# Patient Record
Sex: Female | Born: 1975
Health system: Southern US, Community
[De-identification: ages and names within clinical notes are randomized; demographics above are authoritative.]

## PROBLEM LIST (undated history)

## (undated) ENCOUNTER — Inpatient Hospital Stay (HOSPITAL_COMMUNITY): Payer: Self-pay

## (undated) DIAGNOSIS — N9089 Other specified noninflammatory disorders of vulva and perineum: Secondary | ICD-10-CM

## (undated) DIAGNOSIS — O139 Gestational [pregnancy-induced] hypertension without significant proteinuria, unspecified trimester: Secondary | ICD-10-CM

## (undated) DIAGNOSIS — E7800 Pure hypercholesterolemia, unspecified: Secondary | ICD-10-CM

## (undated) DIAGNOSIS — N911 Secondary amenorrhea: Secondary | ICD-10-CM

## (undated) DIAGNOSIS — R7303 Prediabetes: Secondary | ICD-10-CM

## (undated) DIAGNOSIS — O926 Galactorrhea: Secondary | ICD-10-CM

## (undated) DIAGNOSIS — J302 Other seasonal allergic rhinitis: Secondary | ICD-10-CM

## (undated) DIAGNOSIS — O24419 Gestational diabetes mellitus in pregnancy, unspecified control: Secondary | ICD-10-CM

## (undated) DIAGNOSIS — N750 Cyst of Bartholin's gland: Secondary | ICD-10-CM

## (undated) DIAGNOSIS — O9089 Other complications of the puerperium, not elsewhere classified: Secondary | ICD-10-CM

## (undated) DIAGNOSIS — N920 Excessive and frequent menstruation with regular cycle: Secondary | ICD-10-CM

## (undated) DIAGNOSIS — N8300 Follicular cyst of ovary, unspecified side: Secondary | ICD-10-CM

## (undated) DIAGNOSIS — K644 Residual hemorrhoidal skin tags: Secondary | ICD-10-CM

## (undated) DIAGNOSIS — N946 Dysmenorrhea, unspecified: Secondary | ICD-10-CM

## (undated) DIAGNOSIS — D649 Anemia, unspecified: Secondary | ICD-10-CM

## (undated) DIAGNOSIS — N907 Vulvar cyst: Secondary | ICD-10-CM

## (undated) HISTORY — DX: Dysmenorrhea, unspecified: N94.6

## (undated) HISTORY — DX: Other specified noninflammatory disorders of vulva and perineum: N90.89

## (undated) HISTORY — DX: Follicular cyst of ovary, unspecified side: N83.00

## (undated) HISTORY — DX: Galactorrhea: O92.6

## (undated) HISTORY — DX: Secondary amenorrhea: N91.1

## (undated) HISTORY — DX: Excessive and frequent menstruation with regular cycle: N92.0

## (undated) HISTORY — DX: Residual hemorrhoidal skin tags: K64.4

## (undated) HISTORY — DX: Cyst of Bartholin's gland: N75.0

## (undated) HISTORY — DX: Pure hypercholesterolemia, unspecified: E78.00

## (undated) HISTORY — DX: Other complications of the puerperium, not elsewhere classified: O90.89

## (undated) HISTORY — DX: Vulvar cyst: N90.7

---
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
## 1997-12-24 HISTORY — PX: DILATION AND CURETTAGE OF UTERUS: SHX78

## 2003-01-06 DIAGNOSIS — O1490 Unspecified pre-eclampsia, unspecified trimester: Secondary | ICD-10-CM

## 2007-12-25 HISTORY — PX: WISDOM TOOTH EXTRACTION: SHX21

## 2014-12-24 LAB — OB RESULTS CONSOLE ABO/RH

## 2015-05-25 LAB — OB RESULTS CONSOLE HEPATITIS B SURFACE ANTIGEN: HEP B S AG: NEGATIVE

## 2015-05-25 LAB — OB RESULTS CONSOLE HIV ANTIBODY (ROUTINE TESTING): HIV: NONREACTIVE

## 2015-05-25 LAB — OB RESULTS CONSOLE GC/CHLAMYDIA
CHLAMYDIA, DNA PROBE: NEGATIVE
Gonorrhea: NEGATIVE

## 2015-05-25 LAB — OB RESULTS CONSOLE HGB/HCT, BLOOD
HEMATOCRIT: 37 %
HEMOGLOBIN: 11.3 g/dL

## 2015-05-25 LAB — OB RESULTS CONSOLE ABO/RH: RH TYPE: POSITIVE

## 2015-05-25 LAB — OB RESULTS CONSOLE ANTIBODY SCREEN: Antibody Screen: NEGATIVE

## 2015-05-25 LAB — OB RESULTS CONSOLE PLATELET COUNT: Platelets: 274 10*3/uL

## 2015-05-25 LAB — OB RESULTS CONSOLE RUBELLA ANTIBODY, IGM: RUBELLA: IMMUNE

## 2015-05-25 LAB — OB RESULTS CONSOLE RPR: RPR: NONREACTIVE

## 2015-10-05 LAB — OB RESULTS CONSOLE RPR: RPR: NONREACTIVE

## 2015-11-24 LAB — OB RESULTS CONSOLE GBS: GBS: NEGATIVE

## 2015-11-30 ENCOUNTER — Inpatient Hospital Stay (HOSPITAL_COMMUNITY)
Admission: AD | Admit: 2015-11-30 | Discharge: 2015-11-30 | Disposition: A | Discharge status: Home / Self Care | Payer: 59 | Source: Ambulatory Visit | Attending: Obstetrics and Gynecology | Admitting: Obstetrics and Gynecology

## 2015-11-30 ENCOUNTER — Encounter (HOSPITAL_COMMUNITY): Payer: Self-pay | Admitting: *Deleted

## 2015-11-30 DIAGNOSIS — O26893 Other specified pregnancy related conditions, third trimester: Secondary | ICD-10-CM

## 2015-11-30 DIAGNOSIS — Z3A35 35 weeks gestation of pregnancy: Secondary | ICD-10-CM | POA: Insufficient documentation

## 2015-11-30 DIAGNOSIS — O09523 Supervision of elderly multigravida, third trimester: Secondary | ICD-10-CM | POA: Insufficient documentation

## 2015-11-30 DIAGNOSIS — R220 Localized swelling, mass and lump, head: Secondary | ICD-10-CM

## 2015-11-30 DIAGNOSIS — O1203 Gestational edema, third trimester: Secondary | ICD-10-CM | POA: Diagnosis not present

## 2015-11-30 DIAGNOSIS — Z3A37 37 weeks gestation of pregnancy: Secondary | ICD-10-CM | POA: Diagnosis not present

## 2015-11-30 HISTORY — DX: Gestational diabetes mellitus in pregnancy, unspecified control: O24.419

## 2015-11-30 HISTORY — DX: Gestational (pregnancy-induced) hypertension without significant proteinuria, unspecified trimester: O13.9

## 2015-11-30 LAB — URINALYSIS, ROUTINE W REFLEX MICROSCOPIC
Bilirubin Urine: NEGATIVE
GLUCOSE, UA: NEGATIVE mg/dL
HGB URINE DIPSTICK: NEGATIVE
KETONES UR: NEGATIVE mg/dL
Nitrite: NEGATIVE
PROTEIN: NEGATIVE mg/dL
Specific Gravity, Urine: <1.005 — ABNORMAL LOW (ref 1.005–1.030)
pH: 6.5 (ref 5.0–8.0)

## 2015-11-30 LAB — CBC WITH DIFFERENTIAL/PLATELET
Basophils Absolute: 0 10*3/uL (ref 0.0–0.1)
Basophils Relative: 0 %
Eosinophils Absolute: 0.1 10*3/uL (ref 0.0–0.7)
Eosinophils Relative: 1 %
HEMATOCRIT: 34.5 % — AB (ref 36.0–46.0)
Hemoglobin: 11.1 g/dL — ABNORMAL LOW (ref 12.0–15.0)
LYMPHS PCT: 22 %
Lymphs Abs: 1.8 10*3/uL (ref 0.7–4.0)
MCH: 27.7 pg (ref 26.0–34.0)
MCHC: 32.2 g/dL (ref 30.0–36.0)
MCV: 86 fL (ref 78.0–100.0)
MONO ABS: 0.7 10*3/uL (ref 0.1–1.0)
MONOS PCT: 8 %
NEUTROS ABS: 5.8 10*3/uL (ref 1.7–7.7)
Neutrophils Relative %: 69 %
Platelets: 169 10*3/uL (ref 150–400)
RBC: 4.01 MIL/uL (ref 3.87–5.11)
RDW: 19.2 % — AB (ref 11.5–15.5)
WBC: 8.4 10*3/uL (ref 4.0–10.5)

## 2015-11-30 LAB — COMPREHENSIVE METABOLIC PANEL
ALT: 14 U/L (ref 14–54)
ANION GAP: 7 (ref 5–15)
AST: 18 U/L (ref 15–41)
Albumin: 2.6 g/dL — ABNORMAL LOW (ref 3.5–5.0)
Alkaline Phosphatase: 100 U/L (ref 38–126)
BILIRUBIN TOTAL: 0.4 mg/dL (ref 0.3–1.2)
CO2: 22 mmol/L (ref 22–32)
Calcium: 8.7 mg/dL — ABNORMAL LOW (ref 8.9–10.3)
Chloride: 107 mmol/L (ref 101–111)
Creatinine, Ser: 0.51 mg/dL (ref 0.44–1.00)
Glucose, Bld: 94 mg/dL (ref 65–99)
POTASSIUM: 3.2 mmol/L — AB (ref 3.5–5.1)
Sodium: 136 mmol/L (ref 135–145)
TOTAL PROTEIN: 5.6 g/dL — AB (ref 6.5–8.1)

## 2015-11-30 LAB — URINE MICROSCOPIC-ADD ON: RBC / HPF: NONE SEEN RBC/hpf (ref 0–5)

## 2015-11-30 LAB — PROTEIN / CREATININE RATIO, URINE
CREATININE, URINE: 55 mg/dL
Total Protein, Urine: <6 mg/dL

## 2015-11-30 NOTE — MAU Note (Signed)
Pt presents to MAU with complaints of facial swelling. [facility] [place] and states that she decided to come here stating the physicians are not concerned about her swelling. Denies any leakage of fluid or vaginal bleeding.

## 2015-11-30 NOTE — MAU Provider Note (Signed)
History     CSN: [id number]  Arrival date and time: 11/30/15 4696   First Provider Initiated Contact with Patient 11/30/15 [phone]      Chief Complaint  Patient presents with  . Facial Swelling   HPI  39 yo G5P3013 at 35.2 weeks by LMP presents to the MAU for facial swelling. She has not experienced any HA, vision changes, or RUQ pain, but endorses new facial and hand swelling as well as worsening BLE swelling. Her BPs have been normal, though her systolic BP was 138 at her last appointment. She has a history of pre-eclampsia and gestational diabetes in her first pregnancy as well as gestational HTN in another. She also has a history of macrosomia (9 lbs), which she delivered vaginally. No other concerns during this pregnancy.   Prenatal Labwork: A+ Ab NEGATIVE HIV Neg RPR NR HepB Neg Rubella Immune 1h GTT 180; 3hr GTT passed CBC 10.4/35 Genetic Screening (Quad) Low Risk GC/Chlam neg/neg GBS NEGATIVE  Past Medical History  Diagnosis Date  . Gestational diabetes   . Pregnancy induced hypertension     Past Surgical History  Procedure Laterality Date  . Dilation and curettage of uterus      Family History  Problem Relation Age of Onset  . Stroke Mother     Social History  Substance Use Topics  . Smoking status: Never Smoker   . Smokeless tobacco: Never Used  . Alcohol Use: No    Allergies: No Known Allergies  Prescriptions prior to admission  Medication Sig Dispense Refill Last Dose  . cetirizine (ZYRTEC) 10 MG tablet Take 10 mg by mouth daily.   11/29/2015 at Unknown time  . ferrous sulfate 325 (65 FE) MG tablet Take 325 mg by mouth daily with breakfast.   11/29/2015 at Unknown time  . fluticasone (FLONASE) 50 MCG/ACT nasal spray Place into both nostrils daily.   11/29/2015 at Unknown time  . Prenatal Vit-Fe Fumarate-FA (PRENATAL MULTIVITAMIN) TABS tablet Take 1 tablet by mouth daily at 12 noon.   11/29/2015 at Unknown time    ROS  All other pertinent ROS  negative with exception of those in the HPI Physical Exam   Blood pressure 123/84, pulse 87, resp. rate 18, height  (1.626 m), weight 101.606 kg (224 lb), last menstrual period 03/28/2015.  Physical Exam  Gen: Alert, cooperative, NAD CV: RRR Lungs: CTAB Abd: Gravid, non tender in RUQ Extremities: Non pitting edema to bilateral ankles and feet; nonedematous BUE  FHT: Reactive, reassuring, Cat 1 No contractions noted  MAU Course  Procedures  MDM Patient has a history of gHTN as well as pre-eclampsia. Her BPs have been normal in the MAU, however would like to obtain UPC as well as CBC/CMP to rule out pre-eclampsia and HELLP.    Results for orders placed or performed during the hospital encounter of 11/30/15 (from the past 24 hour(s))  Urinalysis, Routine w reflex microscopic (not at [facility])     Status: Abnormal   Collection Time: 11/30/15  8:50 AM  Result Value Ref Range   Color, Urine YELLOW YELLOW   APPearance CLEAR CLEAR   Specific Gravity, Urine <1.005 (L) 1.005 - 1.030   pH 6.5 5.0 - 8.0   Glucose, UA NEGATIVE NEGATIVE mg/dL   Hgb urine dipstick NEGATIVE NEGATIVE   Bilirubin Urine NEGATIVE NEGATIVE   Ketones, ur NEGATIVE NEGATIVE mg/dL   Protein, ur NEGATIVE NEGATIVE mg/dL   Nitrite NEGATIVE NEGATIVE   Leukocytes, UA SMALL (A) NEGATIVE  Urine microscopic-add  on     Status: Abnormal   Collection Time: 11/30/15  8:50 AM  Result Value Ref Range   Squamous Epithelial / LPF 0-5 (A) NONE SEEN   WBC, UA 0-5 0 - 5 WBC/hpf   RBC / HPF NONE SEEN 0 - 5 RBC/hpf   Bacteria, UA RARE (A) NONE SEEN  Protein / creatinine ratio, urine     Status: None   Collection Time: 11/30/15  8:50 AM  Result Value Ref Range   Creatinine, Urine 55.00 mg/dL   Total Protein, Urine <6 mg/dL   Protein Creatinine Ratio        0.00 - 0.15 mg/mg[Cre]  CBC with Differential/Platelet     Status: Abnormal   Collection Time: 11/30/15  9:49 AM  Result Value Ref Range   WBC 8.4 4.0 - 10.5 K/uL   RBC  4.01 3.87 - 5.11 MIL/uL   Hemoglobin 11.1 (L) 12.0 - 15.0 g/dL   HCT 16.1 (L) 09.6 - 04.5 %   MCV 86.0 78.0 - 100.0 fL   MCH 27.7 26.0 - 34.0 pg   MCHC 32.2 30.0 - 36.0 g/dL   RDW 40.9 (H) 81.1 - 91.4 %   Platelets 169 150 - 400 K/uL   Neutrophils Relative % 69 %   Neutro Abs 5.8 1.7 - 7.7 K/uL   Lymphocytes Relative 22 %   Lymphs Abs 1.8 0.7 - 4.0 K/uL   Monocytes Relative 8 %   Monocytes Absolute 0.7 0.1 - 1.0 K/uL   Eosinophils Relative 1 %   Eosinophils Absolute 0.1 0.0 - 0.7 K/uL   Basophils Relative 0 %   Basophils Absolute 0.0 0.0 - 0.1 K/uL  Comprehensive metabolic panel     Status: Abnormal (Preliminary result)   Collection Time: 11/30/15  9:49 AM  Result Value Ref Range   Sodium 136 135 - 145 mmol/L   Potassium 3.2 (L) 3.5 - 5.1 mmol/L   Chloride 107 101 - 111 mmol/L   CO2 22 22 - 32 mmol/L   Glucose, Bld 94 65 - 99 mg/dL   BUN PENDING 6 - 20 mg/dL   Creatinine, Ser 7.82 0.44 - 1.00 mg/dL   Calcium 8.7 (L) 8.9 - 10.3 mg/dL   Total Protein 5.6 (L) 6.5 - 8.1 g/dL   Albumin 2.6 (L) 3.5 - 5.0 g/dL   AST 18 15 - 41 U/L   ALT 14 14 - 54 U/L   Alkaline Phosphatase 100 38 - 126 U/L   Total Bilirubin 0.4 0.3 - 1.2 mg/dL   GFR calc non Af Amer >60 >60 mL/min   GFR calc Af Amer >60 >60 mL/min   Anion gap 7 5 - 15    Assessment and Plan  39 yo G5P3013 at 35.2 weeks by LMP presents to the MAU for facial swelling. Pre-eclampsia labwork negative with "unable to calculate" UPC due to low proteinuria. Advised to continue with BP monitoring given her history. She is seeing her PCP weekly and will f/u with them. Medical Records will be scanned to chart and patient discharged to home.   Linda Maldonado 11/30/2015, 9:43 AM   OB fellow attestation:  I have seen and examined this patient; I agree with above documentation in the resident's note.   Linda Maldonado is a 39 y.o. [phone] reporting facial swelling +FM, denies LOF, VB, contractions, vaginal discharge.  PE: BP 131/70  mmHg  Pulse 83  Resp 18  Ht  (1.626 m)  Wt 224 lb (101.606 kg)  BMI 38.43 kg/m2  LMP 03/28/2015 Gen: calm comfortable, NAD Resp: normal effort, no distress Abd: gravid  ROS, labs, PMH reviewed NST reactive   Plan: -Preeclampsia labs negative -Patient at risk for PreX given history but does not meet criteria at this time.  -follow up with OB provider  Federico FlakeKimberly Niles Keyshawna Prouse, MD 11:52 AM

## 2015-12-25 NOTE — L&D Delivery Note (Cosign Needed)
Delivery Note  Patient presented in active labor and proceeded without augmentation. Intermittent late decels in active labor and during the second stage.  At 11:53 AM a viable female was delivered via Vaginal, Spontaneous Delivery (Presentation: Right Occiput Anterior).  APGAR: 8, 9; weight 4005 g.   Placenta status: Intact, Spontaneous.  Cord: 3 vessels with the following complications: None.  Cord pH: not obtained.  Anesthesia: Epidural  Episiotomy: None Lacerations: 2nd degree;Perineal Suture Repair: 3.0 vicryl Est. Blood Loss (mL):  50  Mom to postpartum.  Baby to Couplet care / Skin to Skin.  Cherrie Gauzeoah B Meli Faley 12/29/2015, 12:15 PM

## 2015-12-28 ENCOUNTER — Encounter (HOSPITAL_COMMUNITY): Payer: Self-pay | Admitting: *Deleted

## 2015-12-28 ENCOUNTER — Inpatient Hospital Stay (HOSPITAL_COMMUNITY)
Admission: AD | Admit: 2015-12-28 | Discharge: 2015-12-28 | Disposition: A | Discharge status: Home / Self Care | Payer: 59 | Source: Ambulatory Visit | Attending: Obstetrics & Gynecology | Admitting: Obstetrics & Gynecology

## 2015-12-28 NOTE — MAU Note (Signed)
Pt c/o contractions 6-7 mins apart. Denies LOF or vag bleeding. +FM

## 2015-12-28 NOTE — MAU Note (Signed)
Urine in lab 

## 2015-12-28 NOTE — Discharge Instructions (Signed)
Keep your scheduled appointment for prenatal care. Call your doctor or provider on call with further concerns.

## 2015-12-29 ENCOUNTER — Inpatient Hospital Stay (HOSPITAL_COMMUNITY): Payer: 59 | Admitting: Anesthesiology

## 2015-12-29 ENCOUNTER — Inpatient Hospital Stay (HOSPITAL_COMMUNITY)
Admission: AD | Admit: 2015-12-29 | Discharge: 2015-12-31 | DRG: 775 | Disposition: A | Discharge status: Home / Self Care | Payer: 59 | Source: Ambulatory Visit | Attending: Family Medicine | Admitting: Family Medicine

## 2015-12-29 ENCOUNTER — Encounter (HOSPITAL_COMMUNITY): Payer: Self-pay

## 2015-12-29 DIAGNOSIS — Z6841 Body Mass Index (BMI) 40.0 and over, adult: Secondary | ICD-10-CM

## 2015-12-29 DIAGNOSIS — Z3A39 39 weeks gestation of pregnancy: Secondary | ICD-10-CM

## 2015-12-29 DIAGNOSIS — Z823 Family history of stroke: Secondary | ICD-10-CM | POA: Diagnosis not present

## 2015-12-29 DIAGNOSIS — IMO0001 Reserved for inherently not codable concepts without codable children: Secondary | ICD-10-CM

## 2015-12-29 DIAGNOSIS — O99214 Obesity complicating childbirth: Secondary | ICD-10-CM | POA: Diagnosis present

## 2015-12-29 LAB — CBC
HCT: 40 % (ref 36.0–46.0)
Hemoglobin: 13.5 g/dL (ref 12.0–15.0)
MCH: 29.3 pg (ref 26.0–34.0)
MCHC: 33.8 g/dL (ref 30.0–36.0)
MCV: 87 fL (ref 78.0–100.0)
PLATELETS: 203 10*3/uL (ref 150–400)
RBC: 4.6 MIL/uL (ref 3.87–5.11)
RDW: 17.5 % — AB (ref 11.5–15.5)
WBC: 12 10*3/uL — AB (ref 4.0–10.5)

## 2015-12-29 LAB — ABO/RH: ABO/RH(D): A POS

## 2015-12-29 LAB — RPR: RPR Ser Ql: NONREACTIVE

## 2015-12-29 LAB — TYPE AND SCREEN
ABO/RH(D): A POS
ANTIBODY SCREEN: NEGATIVE

## 2015-12-29 MED ORDER — BENZOCAINE-MENTHOL 20-0.5 % EX AERO
1.0000 "application " | INHALATION_SPRAY | CUTANEOUS | Status: DC | PRN
  Filled 2015-12-29: qty 56

## 2015-12-29 MED ORDER — LACTATED RINGERS IV SOLN
INTRAVENOUS | Status: DC
  Administered 2015-12-29: 09:00:00 via INTRAVENOUS

## 2015-12-29 MED ORDER — ACETAMINOPHEN 325 MG PO TABS
650.0000 mg | ORAL_TABLET | ORAL | Status: DC | PRN
  Administered 2015-12-29: 650 mg via ORAL
  Filled 2015-12-29: qty 2

## 2015-12-29 MED ORDER — PHENYLEPHRINE 40 MCG/ML (10ML) SYRINGE FOR IV PUSH (FOR BLOOD PRESSURE SUPPORT)
80.0000 ug | PREFILLED_SYRINGE | INTRAVENOUS | Status: DC | PRN
  Filled 2015-12-29: qty 2
  Filled 2015-12-29: qty 20

## 2015-12-29 MED ORDER — CITRIC ACID-SODIUM CITRATE 334-500 MG/5ML PO SOLN: 30.0000 mL | ORAL | Status: DC | PRN

## 2015-12-29 MED ORDER — FLEET ENEMA 7-19 GM/118ML RE ENEM: 1.0000 | ENEMA | RECTAL | Status: DC | PRN

## 2015-12-29 MED ORDER — ONDANSETRON HCL 4 MG PO TABS: 4.0000 mg | ORAL_TABLET | ORAL | Status: DC | PRN

## 2015-12-29 MED ORDER — SIMETHICONE 80 MG PO CHEW: 80.0000 mg | CHEWABLE_TABLET | ORAL | Status: DC | PRN

## 2015-12-29 MED ORDER — SENNOSIDES-DOCUSATE SODIUM 8.6-50 MG PO TABS
2.0000 | ORAL_TABLET | ORAL | Status: DC
  Administered 2015-12-29 – 2015-12-30 (×2): 2 via ORAL
  Filled 2015-12-29 (×2): qty 2

## 2015-12-29 MED ORDER — TETANUS-DIPHTH-ACELL PERTUSSIS 5-2.5-18.5 LF-MCG/0.5 IM SUSP: 0.5000 mL | Freq: Once | INTRAMUSCULAR | Status: DC

## 2015-12-29 MED ORDER — OXYCODONE-ACETAMINOPHEN 5-325 MG PO TABS: 2.0000 | ORAL_TABLET | ORAL | Status: DC | PRN

## 2015-12-29 MED ORDER — DIPHENHYDRAMINE HCL 50 MG/ML IJ SOLN: 12.5000 mg | INTRAMUSCULAR | Status: DC | PRN

## 2015-12-29 MED ORDER — LANOLIN HYDROUS EX OINT: TOPICAL_OINTMENT | CUTANEOUS | Status: DC | PRN

## 2015-12-29 MED ORDER — FENTANYL 2.5 MCG/ML BUPIVACAINE 1/10 % EPIDURAL INFUSION (WH - ANES)
14.0000 mL/h | INTRAMUSCULAR | Status: DC | PRN
  Administered 2015-12-29 (×2): 14 mL/h via EPIDURAL
  Filled 2015-12-29 (×2): qty 125

## 2015-12-29 MED ORDER — LIDOCAINE HCL (PF) 1 % IJ SOLN
30.0000 mL | INTRAMUSCULAR | Status: DC | PRN
Start: 2015-12-29 — End: 2015-12-29
  Filled 2015-12-29: qty 30

## 2015-12-29 MED ORDER — LACTATED RINGERS IV SOLN
2.5000 [IU]/h | INTRAVENOUS | Status: DC
  Administered 2015-12-29: 2.5 [IU]/h via INTRAVENOUS
  Filled 2015-12-29: qty 4

## 2015-12-29 MED ORDER — OXYCODONE-ACETAMINOPHEN 5-325 MG PO TABS: 1.0000 | ORAL_TABLET | ORAL | Status: DC | PRN

## 2015-12-29 MED ORDER — ONDANSETRON HCL 4 MG/2ML IJ SOLN
4.0000 mg | Freq: Four times a day (QID) | INTRAMUSCULAR | Status: DC | PRN
  Administered 2015-12-29: 4 mg via INTRAVENOUS
  Filled 2015-12-29: qty 2

## 2015-12-29 MED ORDER — IBUPROFEN 600 MG PO TABS
600.0000 mg | ORAL_TABLET | Freq: Four times a day (QID) | ORAL | Status: DC
  Administered 2015-12-29 – 2015-12-31 (×8): 600 mg via ORAL
  Filled 2015-12-29 (×8): qty 1

## 2015-12-29 MED ORDER — EPHEDRINE 5 MG/ML INJ
10.0000 mg | INTRAVENOUS | Status: DC | PRN
  Filled 2015-12-29: qty 2

## 2015-12-29 MED ORDER — PRENATAL MULTIVITAMIN CH
1.0000 | ORAL_TABLET | Freq: Every day | ORAL | Status: DC
Start: 2015-12-30 — End: 2015-12-31
  Administered 2015-12-30 – 2015-12-31 (×2): 1 via ORAL
  Filled 2015-12-29 (×2): qty 1

## 2015-12-29 MED ORDER — DIBUCAINE 1 % RE OINT
1.0000 "application " | TOPICAL_OINTMENT | RECTAL | Status: DC | PRN
  Administered 2015-12-31: 1 via RECTAL
  Filled 2015-12-29: qty 28

## 2015-12-29 MED ORDER — WITCH HAZEL-GLYCERIN EX PADS
1.0000 "application " | MEDICATED_PAD | CUTANEOUS | Status: DC | PRN
  Administered 2015-12-31: 1 via TOPICAL

## 2015-12-29 MED ORDER — LIDOCAINE HCL (PF) 1 % IJ SOLN
INTRAMUSCULAR | Status: DC | PRN
  Administered 2015-12-29 (×2): 4 mL

## 2015-12-29 MED ORDER — FENTANYL CITRATE (PF) 100 MCG/2ML IJ SOLN
50.0000 ug | INTRAMUSCULAR | Status: DC | PRN
  Administered 2015-12-29: 50 ug via INTRAVENOUS
  Filled 2015-12-29: qty 2

## 2015-12-29 MED ORDER — ZOLPIDEM TARTRATE 5 MG PO TABS: 5.0000 mg | ORAL_TABLET | Freq: Every evening | ORAL | Status: DC | PRN

## 2015-12-29 MED ORDER — ACETAMINOPHEN 325 MG PO TABS: 650.0000 mg | ORAL_TABLET | ORAL | Status: DC | PRN

## 2015-12-29 MED ORDER — ONDANSETRON HCL 4 MG/2ML IJ SOLN: 4.0000 mg | INTRAMUSCULAR | Status: DC | PRN

## 2015-12-29 MED ORDER — LACTATED RINGERS IV SOLN
500.0000 mL | INTRAVENOUS | Status: DC | PRN
  Administered 2015-12-29: 500 mL via INTRAVENOUS

## 2015-12-29 MED ORDER — DIPHENHYDRAMINE HCL 25 MG PO CAPS: 25.0000 mg | ORAL_CAPSULE | Freq: Four times a day (QID) | ORAL | Status: DC | PRN

## 2015-12-29 MED ORDER — OXYTOCIN BOLUS FROM INFUSION: 500.0000 mL | INTRAVENOUS | Status: DC

## 2015-12-29 NOTE — Anesthesia Procedure Notes (Signed)
Epidural Patient location during procedure: OB  Staffing Anesthesiologist: Milli Woolridge Performed by: anesthesiologist   Preanesthetic Checklist Completed: patient identified, site marked, surgical consent, pre-op evaluation, timeout performed, IV checked, risks and benefits discussed and monitors and equipment checked  Epidural Patient position: sitting Prep: site prepped and draped and DuraPrep Patient monitoring: continuous pulse ox and blood pressure Approach: midline Location: L3-L4 Injection technique: LOR saline  Needle:  Needle type: Tuohy  Needle gauge: 17 G Needle length: 9 cm and 9 Needle insertion depth: 7 cm Catheter type: closed end flexible Catheter size: 19 Gauge Catheter at skin depth: 12 cm Test dose: negative  Assessment Events: blood not aspirated, injection not painful, no injection resistance, negative IV test and no paresthesia  Additional Notes Patient identified. Risks/Benefits/Options discussed with patient including but not limited to bleeding, infection, nerve damage, paralysis, failed block, incomplete pain control, headache, blood pressure changes, nausea, vomiting, reactions to medication both or allergic, itching and postpartum back pain. Confirmed with bedside nurse the patient's most recent platelet count. Confirmed with patient that they are not currently taking any anticoagulation, have any bleeding history or any family history of bleeding disorders. Patient expressed understanding and wished to proceed. All questions were answered. Sterile technique was used throughout the entire procedure. Please see nursing notes for vital signs. Test dose was given through epidural catheter and negative prior to continuing to dose epidural or start infusion. Warning signs of high block given to the patient including shortness of breath, tingling/numbness in hands, complete motor block, or any concerning symptoms with instructions to call for help. Patient was  given instructions on fall risk and not to get out of bed. All questions and concerns addressed with instructions to call with any issues or inadequate analgesia.      

## 2015-12-29 NOTE — Progress Notes (Signed)
Notified of pt arrival in MAU and exam. Will come see pt 

## 2015-12-29 NOTE — Lactation Note (Signed)
This note was copied from the chart of Linda Maldonado. Lactation Consultation Note  Patient Name: Linda Tamela Oddingelita Doebler GEXBM'WToday's Date: 12/29/2015 Reason for consult: Initial assessment Baby at 10 hr of life and mom reports feeding are going well. Encouraged mom to call at next feeding so RN or LC could see a latch. Mom denies breast or nipple pain, no skin break down noted. Discussed baby behavior, feeding frequency, baby belly size, voids, wt loss, breast changes, and nipple care. She stated that she can manually express and has seen colostrum. Given lactation handouts. Aware of OP services and support group. Shew ill call as needed for bf help.     Maternal Data Has patient been taught Hand Expression?: Yes Does the patient have breastfeeding experience prior to this delivery?: Yes  Feeding Feeding Type: Breast Fed Length of feed: 15 min  LATCH Score/Interventions                      Lactation Tools Discussed/Used WIC Program: Yes   Consult Status Consult Status: Follow-up Date: 12/30/15 Follow-up type: In-patient    Rulon Eisenmengerlizabeth E Kyrel Leighton 12/29/2015, 10:11 PM

## 2015-12-29 NOTE — H&P (Signed)
Linda Maldonado is a 40 y.o. female [phone] with IUP at [redacted]w[redacted]d presenting for contractions. Pt states she has been having regular, every 3-5 minutes contractions, associated with none vaginal bleeding.  Membranes are intact, with active fetal movement.   [facility] 8 [facility]  Prenatal History/Complications:  Past Medical History: Past Medical History  Diagnosis Date  . Gestational diabetes   . Pregnancy induced hypertension     Past Surgical History: Past Surgical History  Procedure Laterality Date  . Dilation and curettage of uterus      Obstetrical History: OB History    Gravida Para Term Preterm AB TAB SAB Ectopic Multiple Living   4 3 3       3       Gynecological History: OB History    Gravida Para Term Preterm AB TAB SAB Ectopic Multiple Living   4 3 3       3       Social History: Social History   Social History  . Marital Status: Single    Spouse Name: N/A  . Number of Children: N/A  . Years of Education: N/A   Social History Main Topics  . Smoking status: Never Smoker   . Smokeless tobacco: Never Used  . Alcohol Use: No  . Drug Use: No  . Sexual Activity: Yes    Birth Control/ Protection: Pill   Other Topics Concern  . None   Social History Narrative    Family History: Family History  Problem Relation Age of Onset  . Stroke Mother     Allergies: No Known Allergies  Prescriptions prior to admission  Medication Sig Dispense Refill Last Dose  . acetaminophen (TYLENOL) 325 MG tablet Take 325 mg by mouth every 6 (six) hours as needed for fever.   Past Week at Unknown time  . cetirizine (ZYRTEC) 10 MG tablet Take 10 mg by mouth daily.   12/27/2015 at Unknown time  . fluticasone (FLONASE) 50 MCG/ACT nasal spray Place into both nostrils daily.   12/27/2015 at Unknown time  . IRON PO Take 1 tablet by mouth daily.   12/28/2015 at Unknown time  . Prenatal Vit-Fe Fumarate-FA (PRENATAL MULTIVITAMIN) TABS tablet Take 1 tablet by mouth  daily at 12 noon.   12/28/2015 at Unknown time     Review of Systems   Constitutional: No fevers or chills  Blood pressure 149/61, pulse 113, resp. rate 20, height 5\' 2"  (1.575 m), weight 105.325 kg (232 lb 3.2 oz), last menstrual period 03/28/2015, SpO2 100 %. General appearance: alert, cooperative and appears stated age Lungs: clear to auscultation bilaterally Heart: regular rate and rhythm Abdomen: soft, non-tender; bowel sounds normal Extremities: Homans sign is negative, no sign of DVT DTR's normal Presentation: cephalic Fetal monitoringBaseline: 135 bpm, Variability: Good {> 6 bpm), Accelerations: Reactive and Decelerations: Absent Uterine activity: Contractions every 3-5 minutes  Dilation: 4.5 Effacement (%): 60 Station: -2 Exam by:: Scientist, product/process development RN   Prenatal labs: ABO, Rh: --/Positive/-- (06/01 0000) Antibody: Negative (06/01 0000) Rubella: !Error! RPR: Nonreactive (10/12 0000)  HBsAg: Negative (06/01 0000)  HIV: Non-reactive (06/01 0000)  GBS: Negative (12/01 0000)  3 hr Glucola 96, 178, 96, 69 Genetic screening  Negative Anatomy US Normal but incomplete. Follow up scan not available   Prenatal Transfer Tool  Maternal Diabetes: No Genetic Screening: Normal Maternal Ultrasounds/Referrals: Normal Fetal Ultrasounds or other Referrals:  None Maternal Substance Abuse:  No Significant Maternal Medications:  None Significant Maternal Lab Results: None  No results found for this or any previous visit (from the past 24 hour(s)).  Assessment: Linda Maldonado is a 40 y.o. [phone] at 8164w3d here for active labor #Labor: SOL, expectant management #Pain: Fentanyl and epidural upon request #FWB: Cat 1 #ID:  GBS negative #MOF: Breast #MOC: Unsure,  interested in BTL eventually #Circ:  Yes  Jacquiline DoeCaleb Parker 12/29/2015, 2:15 AM   CNM attestation:  I have seen and examined this patient; I agree with above documentation in the resident's note.   Linda Maldonado is a  40 y.o. [phone] here for labor  PE: BP 136/68 mmHg  Pulse 80  Temp(Src) 97.8 F (36.6 C) (Axillary)  Resp 16  Ht 5\' 2"  (1.575 m)  Wt 105.235 kg (232 lb)  BMI 42.42 kg/m2  SpO2 100%  LMP 03/28/2015  Resp: normal effort, no distress Abd: gravid  ROS, labs, PMH reviewed  [facility]: [facility] Expectant management Anticipate SVD  Cam HaiSHAW, KIMBERLY CNM 12/29/2015, 4:46 AM

## 2015-12-29 NOTE — MAU Note (Signed)
Contractions every 4-5 mins. Denies LOF. Having some spotting-cervix was 2 cm yesterday. +FM

## 2015-12-29 NOTE — Anesthesia Preprocedure Evaluation (Signed)
Anesthesia Evaluation  Patient identified by MRN, date of birth, ID band Patient awake    Reviewed: Allergy & Precautions, NPO status , Patient's Chart, lab work & pertinent test results  History of Anesthesia Complications Negative for: history of anesthetic complications  Airway Mallampati: II  TM Distance: >3 FB Neck ROM: Full    Dental no notable dental hx. (+) Dental Advisory Given   Pulmonary neg pulmonary ROS,    Pulmonary exam normal breath sounds clear to auscultation       Cardiovascular negative cardio ROS Normal cardiovascular exam Rhythm:Regular Rate:Normal     Neuro/Psych negative neurological ROS  negative psych ROS   GI/Hepatic negative GI ROS, Neg liver ROS,   Endo/Other  Morbid obesity  Renal/GU negative Renal ROS  negative genitourinary   Musculoskeletal negative musculoskeletal ROS (+)   Abdominal   Peds negative pediatric ROS (+)  Hematology negative hematology ROS (+)   Anesthesia Other Findings   Reproductive/Obstetrics (+) Pregnancy                             Anesthesia Physical Anesthesia Plan  ASA: III  Anesthesia Plan: Epidural   Post-op Pain Management:    Induction:   Airway Management Planned:   Additional Equipment:   Intra-op Plan:   Post-operative Plan:   Informed Consent: I have reviewed the patients History and Physical, chart, labs and discussed the procedure including the risks, benefits and alternatives for the proposed anesthesia with the patient or authorized representative who has indicated his/her understanding and acceptance.   Dental advisory given  Plan Discussed with: CRNA  Anesthesia Plan Comments:         Anesthesia Quick Evaluation  

## 2015-12-29 NOTE — Progress Notes (Signed)
Pt. Was 2 below and firm, then at next check a few hours later she was 2 above and to the left but still firm.  Pt. Voided 550 and was rechecked.  She was then 2 above but midline and still firm.  Bleeding has been small throughout the day.  Report given to Night shift RN.

## 2015-12-29 NOTE — Progress Notes (Signed)
Linda Maldonado is a 40 y.o. [phone] at 2368w3d  admitted for active labor  Subjective: Comfortable with epidural  Objective: Filed Vitals:   12/29/15 [id number] 12/29/15 0631 12/29/15 [id number] 12/29/15 0638  BP:  132/77    Pulse: 80 87 77 87  Temp:      TempSrc:      Resp:      Height:      Weight:      SpO2: 98%  97% 97%      FHT:  FHR: 150 bpm, variability: moderate,  accelerations:  Present,  decelerations:  Absent UC:   regular, every 1-3 minutes SVE:   Dilation: 6.5 Effacement (%): 80 Station: -2 Exam by:: M.Merrill, RN  Labs: Lab Results  Component Value Date   WBC 12.0* 12/29/2015   HGB 13.5 12/29/2015   HCT 40.0 12/29/2015   MCV 87.0 12/29/2015   PLT 203 12/29/2015    Assessment / Plan: Spontaneous labor, progressing normally  Labor: Progressing normally Fetal Wellbeing:  Category I Pain Control:  Epidural Anticipated MOD:  NSVD  Linda Maldonado 12/29/2015, 6:59 AM

## 2015-12-29 NOTE — Progress Notes (Signed)
Labor Progress Note  Obie Dredgengelita S Stamper is a 40 y.o. [phone] at 4923w3d  admitted for active labor  S:  Patient doing well. No concerns. Comfortable with epidural.   O:  BP 128/65 mmHg  Pulse 100  Temp(Src) 98.3 F (36.8 C) (Oral)  Resp 18  Ht 5\' 2"  (1.575 m)  Wt 105.235 kg (232 lb)  BMI 42.42 kg/m2  SpO2 100%  LMP 03/28/2015    FHT:  FHR: 120-130 bpm, variability: moderate,  accelerations:  Present,  decelerations:  Present variable  UC:   Every 1-3 mins SVE:   Dilation: 9 Effacement (%): 100 Station: +1 Exam by:: Wouk, MD SROM 1/5  At 0455 clear   Labs: Lab Results  Component Value Date   WBC 12.0* 12/29/2015   HGB 13.5 12/29/2015   HCT 40.0 12/29/2015   MCV 87.0 12/29/2015   PLT 203 12/29/2015    Assessment / Plan: 40 y.o. G4P3003 6023w3d in active labor Spontaneous labor, progressing normally  Labor: Progressing normally Fetal Wellbeing:  Category I Pain Control:  Epidural Anticipated MOD:  NSVD  Expectant management   Palma HolterKanishka G Deloss Amico PGY1 Family Medicine

## 2015-12-30 NOTE — Anesthesia Postprocedure Evaluation (Signed)
Anesthesia Post Note  Patient: Linda Maldonado  Procedure(s) Performed: * No procedures listed *  Patient location during evaluation: Mother Baby Anesthesia Type: Epidural Level of consciousness: oriented and awake and alert Pain management: pain level controlled Vital Signs Assessment: post-procedure vital signs reviewed and stable Respiratory status: spontaneous breathing, nonlabored ventilation and respiratory function stable Cardiovascular status: blood pressure returned to baseline Postop Assessment: no headache, no backache, patient able to bend at knees, no signs of nausea or vomiting and adequate PO intake Anesthetic complications: no    Last Vitals:  Filed Vitals:   12/30/15 0200 12/30/15 0612  BP: 135/68 130/64  Pulse: 81 81  Temp: 36.8 C 36.6 C  Resp: 18 18    Last Pain:  Filed Vitals:   12/30/15 0613  PainSc: 2                  Kambra Beachem

## 2015-12-30 NOTE — Lactation Note (Signed)
This note was copied from the chart of Linda Maldonado. Lactation Consultation Note  Patient Name: Linda Tamela Oddingelita Jarmon ZOXWR'UToday's Date: 12/30/2015 Reason for consult: Follow-up assessment  Experienced breastfeeding Mom has no questions or concerns.   Lurline HareRichey, Riot Barrick [facility] 12/30/2015, 10:08 AM

## 2015-12-31 MED ORDER — IBUPROFEN 600 MG PO TABS: 600.0000 mg | ORAL_TABLET | Freq: Four times a day (QID) | ORAL | Status: DC | PRN

## 2015-12-31 NOTE — Discharge Summary (Signed)
OB Discharge Summary     Patient Name: Linda Maldonado DOB: 1976-03-29 MRN: [id number]  Date of admission: 12/29/2015 Delivering MD: Shonna Chock BEDFORD   Date of discharge: 12/31/2015  Admitting diagnosis: 40 WEEKS CTX Intrauterine pregnancy: [redacted]w[redacted]d     Secondary diagnosis:  Active Problems:   Active labor  Additional problems: none     Discharge diagnosis: Term Pregnancy Delivered                                                                                                Post partum procedures:none  Augmentation: none  Complications: None  Hospital course:  Onset of Labor With Vaginal Delivery     40 y.o. yo [id number] at [redacted]w[redacted]d was admitted in Latent Labor on 12/29/2015. Patient had an uncomplicated labor course as follows:  Membrane Rupture Time/Date: 4:55 AM ,12/29/2015   Intrapartum Procedures: Episiotomy: None [1]                                         Lacerations:  2nd degree [3];Perineal [11]  Patient had a delivery of a Viable infant. 12/29/2015  Information for the patient's newborn:  Linda, Maldonado [[id number]]  Delivery Method: Vaginal, Spontaneous Delivery (Filed from Delivery Summary)    Pateint had an uncomplicated postpartum course.  She is ambulating, tolerating a regular diet, passing flatus, and urinating well. Patient is discharged home in stable condition on 12/31/2015.    Physical exam  Filed Vitals:   12/30/15 0200 12/30/15 0612 12/30/15 1800 12/31/15 0504  BP: 135/68 130/64 130/78 123/67  Pulse: 81 81 82 73  Temp: 98.2 F (36.8 C) 97.9 F (36.6 C) 98.4 F (36.9 C) 98 F (36.7 C)  TempSrc: Oral Oral Oral Oral  Resp: 18 18 19 18   Height:      Weight:      SpO2:   100% 99%   General: alert and cooperative Lochia: appropriate Uterine Fundus: firm Incision: N/A DVT Evaluation: No evidence of DVT seen on physical exam. Labs: Lab Results  Component Value Date   WBC 12.0* 12/29/2015   HGB 13.5 12/29/2015   HCT 40.0 12/29/2015   MCV  87.0 12/29/2015   PLT 203 12/29/2015   CMP Latest Ref Rng 11/30/2015  Glucose 65 - 99 mg/dL 94  BUN 6 - 20 mg/dL <1(H)  Creatinine 0.86 - 1.00 mg/dL 5.78  Sodium 469 - 629 mmol/L 136  Potassium 3.5 - 5.1 mmol/L 3.2(L)  Chloride 101 - 111 mmol/L 107  CO2 22 - 32 mmol/L 22  Calcium 8.9 - 10.3 mg/dL 5.2(W)  Total Protein 6.5 - 8.1 g/dL 4.1(L)  Total Bilirubin 0.3 - 1.2 mg/dL 0.4  Alkaline Phos 38 - 126 U/L 100  AST 15 - 41 U/L 18  ALT 14 - 54 U/L 14    Discharge instruction: per After Visit Summary and "Baby and Me Booklet".  After visit meds:    Medication List    STOP taking these medications  acetaminophen 325 MG tablet  Commonly known as:  TYLENOL     cetirizine 10 MG tablet  Commonly known as:  ZYRTEC      TAKE these medications        fluticasone 50 MCG/ACT nasal spray  Commonly known as:  FLONASE  Place into both nostrils daily.     ibuprofen 600 MG tablet  Commonly known as:  ADVIL,MOTRIN  Take 1 tablet (600 mg total) by mouth every 6 (six) hours as needed.     IRON PO  Take 1 tablet by mouth daily.     prenatal multivitamin Tabs tablet  Take 1 tablet by mouth daily at 12 noon.        Diet: routine diet  Activity: Advance as tolerated. Pelvic rest for 6 weeks.   Outpatient follow up:6 weeks Follow up Appt:No future appointments. Follow up Visit:No Follow-up on file.  Postpartum contraception: Progesterone only [facility]  Newborn Data: Live born female  Birth Weight: 8 lb 13.3 oz (4005 g) APGAR: 8, 9  Baby Feeding: Breast Disposition:home with mother   12/31/2015 Cam HaiSHAW, KIMBERLY, CNM  7:44 AM

## 2015-12-31 NOTE — Lactation Note (Signed)
This note was copied from the chart of Boy Jaryn Steptoe. Lactation Consultation Note  P4, Ex BF History of Mastitis. Reviewed S&S of mastitis and prevention.  Provided mother w/ hand pump. Reviewed cluster feeding, waking to feed, engorgement care and monitoring voids/stools.   Patient Name: Boy Tamela Oddingelita Dibiasio ZOXWR'UToday's Date: 12/31/2015 Reason for consult: Follow-up assessment   Maternal Data    Feeding Length of feed: 15 min  LATCH Score/Interventions                      Lactation Tools Discussed/Used     Consult Status Consult Status: Complete    Hardie PulleyBerkelhammer, Robby Pirani Boschen 12/31/2015, 9:11 AM

## 2015-12-31 NOTE — Discharge Instructions (Signed)

## 2018-08-28 DIAGNOSIS — Z1231 Encounter for screening mammogram for malignant neoplasm of breast: Secondary | ICD-10-CM | POA: Diagnosis not present

## 2018-08-28 DIAGNOSIS — Z01419 Encounter for gynecological examination (general) (routine) without abnormal findings: Secondary | ICD-10-CM | POA: Diagnosis not present

## 2018-08-28 DIAGNOSIS — Z6832 Body mass index (BMI) 32.0-32.9, adult: Secondary | ICD-10-CM | POA: Diagnosis not present

## 2018-08-28 DIAGNOSIS — Z1151 Encounter for screening for human papillomavirus (HPV): Secondary | ICD-10-CM | POA: Diagnosis not present

## 2018-08-28 DIAGNOSIS — Z3009 Encounter for other general counseling and advice on contraception: Secondary | ICD-10-CM | POA: Diagnosis not present

## 2018-08-28 DIAGNOSIS — N750 Cyst of Bartholin's gland: Secondary | ICD-10-CM | POA: Diagnosis not present

## 2018-09-03 DIAGNOSIS — Z1329 Encounter for screening for other suspected endocrine disorder: Secondary | ICD-10-CM | POA: Diagnosis not present

## 2018-09-03 DIAGNOSIS — Z130 Encounter for screening for diseases of the blood and blood-forming organs and certain disorders involving the immune mechanism: Secondary | ICD-10-CM | POA: Diagnosis not present

## 2018-09-03 DIAGNOSIS — Z131 Encounter for screening for diabetes mellitus: Secondary | ICD-10-CM | POA: Diagnosis not present

## 2018-09-03 DIAGNOSIS — Z0000 Encounter for general adult medical examination without abnormal findings: Secondary | ICD-10-CM | POA: Diagnosis not present

## 2018-09-03 DIAGNOSIS — Z13220 Encounter for screening for lipoid disorders: Secondary | ICD-10-CM | POA: Diagnosis not present

## 2018-09-04 ENCOUNTER — Other Ambulatory Visit: Payer: Self-pay | Admitting: Obstetrics and Gynecology

## 2018-09-04 DIAGNOSIS — N750 Cyst of Bartholin's gland: Secondary | ICD-10-CM

## 2018-09-09 ENCOUNTER — Other Ambulatory Visit: Payer: Self-pay | Admitting: Obstetrics and Gynecology

## 2018-09-09 ENCOUNTER — Ambulatory Visit
Admission: RE | Admit: 2018-09-09 | Discharge: 2018-09-09 | Disposition: A | Discharge status: Home / Self Care | Payer: Self-pay | Source: Ambulatory Visit | Attending: Obstetrics and Gynecology | Admitting: Obstetrics and Gynecology

## 2018-09-09 DIAGNOSIS — N750 Cyst of Bartholin's gland: Secondary | ICD-10-CM | POA: Diagnosis not present

## 2018-09-12 ENCOUNTER — Telehealth: Payer: Self-pay | Admitting: *Deleted

## 2018-09-12 DIAGNOSIS — N907 Vulvar cyst: Secondary | ICD-10-CM | POA: Diagnosis not present

## 2018-09-12 NOTE — Telephone Encounter (Signed)
Sue [facility]Andrea from [facility] called and scheduled the patient for a new patient appt. She will call and notify the patient of the appt.

## 2018-09-16 ENCOUNTER — Inpatient Hospital Stay: Payer: BLUE CROSS/BLUE SHIELD | Attending: Gynecology | Admitting: Gynecology

## 2018-09-16 ENCOUNTER — Encounter: Payer: Self-pay | Admitting: *Deleted

## 2018-09-16 VITALS — BP 126/79 | HR 88 | Temp 98.7°F | Resp 20 | Ht 64.0 in | Wt 183.4 lb

## 2018-09-16 DIAGNOSIS — R1900 Intra-abdominal and pelvic swelling, mass and lump, unspecified site: Secondary | ICD-10-CM

## 2018-09-16 NOTE — H&P (View-Only) (Signed)
Consult Note: Gyn-Onc   Linda Maldonado 42 y.o. female    Assessment : Bilateral cystic lesions arising in the labia minora.  These are symptomatic causing vulvar irritation when the patient exercises. Plan: I would recommend surgical excision of cystic lesions bilaterally.  This can be done as a outpatient surgical procedure.  I discussed the procedure with the patient including the risks of infection bleeding and pain.  She would like to proceed with surgery as soon as possible.  All of her questions were answered.  We will tentatively schedule surgery for October 8 pending release of block time in the outpatient surgical center.   HPI: 42 year old female gravida 4 seen in consultation request of Dr.Susan Almquist regarding management of bilateral vulvar cystic lesions.  The patient reports that the cyst first occurred during pregnancy 16 years ago.  The cyst over time have gradually gotten larger and more symptomatic.  On some days they are painful.  There also bothersome when the patient exercises or wears tight clothing.  Ultrasound has been obtained of the vulva showing 2 cysts on the left side one measuring 1.6 x 0.7 x 1 cm and the other measuring 2.7 x 1.8 x 2.4 cm.  The largest cyst seems to have a solid-appearing characteristics with internal echoes.  On the right side is a 1.8 x 0.9 x 1.3 cm cyst.  Patient has regular cyclic menses.  She is not currently using any contraception.  She has no other medical problems.  Review of Systems:10 point review of systems is negative except as noted in interval history.   Vitals: Blood pressure 126/79, pulse 88, temperature 98.7 F (37.1 C), temperature source Oral, resp. rate 20, height 5\' 4"  (1.626 m), weight 183 lb 6.4 oz (83.2 kg), SpO2 100 %, unknown if currently breastfeeding.  Physical Exam: General : The patient is a healthy woman in no acute distress.  HEENT: normocephalic, extraoccular movements normal; neck is supple without  thyromegally  Lynphnodes: Supraclavicular and inguinal nodes not enlarged  Abdomen: Soft, non-tender, no ascites, no organomegally, no masses, no hernias  Pelvic:  EGBUS: Extending from the left labia minora is a approximately 3 cm cystic lesion with an adjacent 1 cm cystic lesion.  Both are in continuity and extruding from the labia minora rather than lying within it.  On the right side, however, is a cystic lesion beneath the labia minora measuring approximately 2 cm.  None are painful and are easily mobile. Vagina: Normal, no lesions  Urethra and Bladder: Normal, non-tender  Cervix: Normal Uterus: Normal shape size and consistency Bi-manual examination: Non-tender; no adenxal masses or nodularity  Rectal: normal sphincter tone, no masses, no blood  Lower extremities: No edema or varicosities. Normal range of motion      No Known Allergies  Past Medical History:  Diagnosis Date  . Bartholin cyst   . Chiari Frommel syndrome   . Dysmenorrhea   . External hemorrhoids   . Follicular cyst of ovary   . Gestational diabetes 1996  . Hemorrhoids   . High cholesterol   . Menorrhagia   . Pregnancy induced hypertension   . Vulvar cysts   . Vulvar mass     Past Surgical History:  Procedure Laterality Date  . DILATION AND CURETTAGE OF UTERUS    . Wisedom Teeth  2009    Current Outpatient Medications  Medication Sig Dispense Refill  . Norethindrone Acetate-Ethinyl Estrad-FE (JUNEL FE 24) 1-20 MG-MCG(24) tablet Take 1 tablet by mouth daily.  No current facility-administered medications for this visit.     Social History   Socioeconomic History  . Marital status: Single    Spouse name: Not on file  . Number of children: Not on file  . Years of education: Not on file  . Highest education level: Not on file  Occupational History  . Not on file  Social Needs  . Financial resource strain: Not on file  . Food insecurity:    Worry: Not on file    Inability: Not on file  .  Transportation needs:    Medical: Not on file    Non-medical: Not on file  Tobacco Use  . Smoking status: Never Smoker  . Smokeless tobacco: Never Used  Substance and Sexual Activity  . Alcohol use: Yes    Comment: Occasional  . Drug use: Yes    Types: Marijuana    Comment: Ocassional  . Sexual activity: Yes    Birth control/protection: Pill  Lifestyle  . Physical activity:    Days per week: Not on file    Minutes per session: Not on file  . Stress: Not on file  Relationships  . Social connections:    Talks on phone: Not on file    Gets together: Not on file    Attends religious service: Not on file    Active member of club or organization: Not on file    Attends meetings of clubs or organizations: Not on file    Relationship status: Not on file  . Intimate partner violence:    Fear of current or ex partner: Not on file    Emotionally abused: Not on file    Physically abused: Not on file    Forced sexual activity: Not on file  Other Topics Concern  . Not on file  Social History Narrative  . Not on file    Family History  Problem Relation Age of Onset  . Stroke Mother   . Diabetes Maternal Grandmother   . Breast cancer Cousin   . Diabetes Other       De Blanchaniel Clarke-Pearson, MD 09/16/2018, 12:43 PM

## 2018-09-16 NOTE — Progress Notes (Signed)
Consult Note: Gyn-Onc   Linda Maldonado 42 y.o. female    Assessment : Bilateral cystic lesions arising in the labia minora.  These are symptomatic causing vulvar irritation when the patient exercises. Plan: I would recommend surgical excision of cystic lesions bilaterally.  This can be done as a outpatient surgical procedure.  I discussed the procedure with the patient including the risks of infection bleeding and pain.  She would like to proceed with surgery as soon as possible.  All of her questions were answered.  We will tentatively schedule surgery for October 8 pending release of block time in the outpatient surgical center.   HPI: 42 year old female gravida 4 seen in consultation request of Dr.Susan Almquist regarding management of bilateral vulvar cystic lesions.  The patient reports that the cyst first occurred during pregnancy 16 years ago.  The cyst over time have gradually gotten larger and more symptomatic.  On some days they are painful.  There also bothersome when the patient exercises or wears tight clothing.  Ultrasound has been obtained of the vulva showing 2 cysts on the left side one measuring 1.6 x 0.7 x 1 cm and the other measuring 2.7 x 1.8 x 2.4 cm.  The largest cyst seems to have a solid-appearing characteristics with internal echoes.  On the right side is a 1.8 x 0.9 x 1.3 cm cyst.  Patient has regular cyclic menses.  She is not currently using any contraception.  She has no other medical problems.  Review of Systems:10 point review of systems is negative except as noted in interval history.   Vitals: Blood pressure 126/79, pulse 88, temperature 98.7 F (37.1 C), temperature source Oral, resp. rate 20, height 5\' 4"  (1.626 m), weight 183 lb 6.4 oz (83.2 kg), SpO2 100 %, unknown if currently breastfeeding.  Physical Exam: General : The patient is a healthy woman in no acute distress.  HEENT: normocephalic, extraoccular movements normal; neck is supple without  thyromegally  Lynphnodes: Supraclavicular and inguinal nodes not enlarged  Abdomen: Soft, non-tender, no ascites, no organomegally, no masses, no hernias  Pelvic:  EGBUS: Extending from the left labia minora is a approximately 3 cm cystic lesion with an adjacent 1 cm cystic lesion.  Both are in continuity and extruding from the labia minora rather than lying within it.  On the right side, however, is a cystic lesion beneath the labia minora measuring approximately 2 cm.  None are painful and are easily mobile. Vagina: Normal, no lesions  Urethra and Bladder: Normal, non-tender  Cervix: Normal Uterus: Normal shape size and consistency Bi-manual examination: Non-tender; no adenxal masses or nodularity  Rectal: normal sphincter tone, no masses, no blood  Lower extremities: No edema or varicosities. Normal range of motion      No Known Allergies  Past Medical History:  Diagnosis Date  . Bartholin cyst   . Chiari Frommel syndrome   . Dysmenorrhea   . External hemorrhoids   . Follicular cyst of ovary   . Gestational diabetes 1996  . Hemorrhoids   . High cholesterol   . Menorrhagia   . Pregnancy induced hypertension   . Vulvar cysts   . Vulvar mass     Past Surgical History:  Procedure Laterality Date  . DILATION AND CURETTAGE OF UTERUS    . Wisedom Teeth  2009    Current Outpatient Medications  Medication Sig Dispense Refill  . Norethindrone Acetate-Ethinyl Estrad-FE (JUNEL FE 24) 1-20 MG-MCG(24) tablet Take 1 tablet by mouth daily.  No current facility-administered medications for this visit.     Social History   Socioeconomic History  . Marital status: Single    Spouse name: Not on file  . Number of children: Not on file  . Years of education: Not on file  . Highest education level: Not on file  Occupational History  . Not on file  Social Needs  . Financial resource strain: Not on file  . Food insecurity:    Worry: Not on file    Inability: Not on file  .  Transportation needs:    Medical: Not on file    Non-medical: Not on file  Tobacco Use  . Smoking status: Never Smoker  . Smokeless tobacco: Never Used  Substance and Sexual Activity  . Alcohol use: Yes    Comment: Occasional  . Drug use: Yes    Types: Marijuana    Comment: Ocassional  . Sexual activity: Yes    Birth control/protection: Pill  Lifestyle  . Physical activity:    Days per week: Not on file    Minutes per session: Not on file  . Stress: Not on file  Relationships  . Social connections:    Talks on phone: Not on file    Gets together: Not on file    Attends religious service: Not on file    Active member of club or organization: Not on file    Attends meetings of clubs or organizations: Not on file    Relationship status: Not on file  . Intimate partner violence:    Fear of current or ex partner: Not on file    Emotionally abused: Not on file    Physically abused: Not on file    Forced sexual activity: Not on file  Other Topics Concern  . Not on file  Social History Narrative  . Not on file    Family History  Problem Relation Age of Onset  . Stroke Mother   . Diabetes Maternal Grandmother   . Breast cancer Cousin   . Diabetes Other       De Blanchaniel Clarke-Pearson, MD 09/16/2018, 12:43 PM

## 2018-09-16 NOTE — Patient Instructions (Signed)
We will plan for surgery on September 30, 2018 with Dr. Stanford Breedlarke-Pearson at [facility] pending release of block time at the surgery center.  I will call you on September 23, 2018 with an update.    Plan for excision of right and left labia minora cysts.  Once scheduled, you will receive a phone call from the pre-surgical RN to discuss instructions.

## 2018-09-24 ENCOUNTER — Telehealth: Payer: Self-pay

## 2018-09-24 NOTE — Telephone Encounter (Signed)
Told Ms Shifrin that her surgery is scheduled for Tuesday 09-30-18 at 0845 with Dr. Stanford Breed. She will receive a call a day or two prior from the outpatient surgical nurse with instructions. Pt verbalized understanding.

## 2018-09-25 ENCOUNTER — Other Ambulatory Visit: Payer: Self-pay

## 2018-09-25 ENCOUNTER — Encounter (HOSPITAL_BASED_OUTPATIENT_CLINIC_OR_DEPARTMENT_OTHER): Payer: Self-pay

## 2018-09-25 NOTE — Progress Notes (Signed)
Spoke with:  Chistine  NPO:  After Midnight, no gum, candy, or mints   Arrival time: 1610RU Labs: UPT, Istat4  AM medications: None Pre op orders:  Yes Ride home: Ethelene Browns (son) [phone]

## 2018-09-29 ENCOUNTER — Other Ambulatory Visit: Payer: Self-pay | Admitting: Gynecologic Oncology

## 2018-09-29 DIAGNOSIS — G8918 Other acute postprocedural pain: Secondary | ICD-10-CM

## 2018-09-29 MED ORDER — OXYCODONE HCL 5 MG PO TABS: 5.0000 mg | ORAL_TABLET | ORAL | 0 refills | Status: DC | PRN

## 2018-09-29 NOTE — Anesthesia Preprocedure Evaluation (Addendum)
Anesthesia Evaluation  Patient identified by MRN, date of birth, ID band Patient awake    Reviewed: Allergy & Precautions, NPO status , Patient's Chart, lab work & pertinent test results  History of Anesthesia Complications Negative for: history of anesthetic complications  Airway Mallampati: I  TM Distance: >3 FB Neck ROM: Full    Dental no notable dental hx. (+) Dental Advisory Given, Chipped,    Pulmonary neg pulmonary ROS,    Pulmonary exam normal breath sounds clear to auscultation       Cardiovascular hypertension, Normal cardiovascular exam Rhythm:Regular Rate:Normal     Neuro/Psych negative neurological ROS  negative psych ROS   GI/Hepatic negative GI ROS, Neg liver ROS,   Endo/Other  diabetesBMI 31  Renal/GU negative Renal ROS   Vulvar cyst    Musculoskeletal negative musculoskeletal ROS (+)   Abdominal   Peds negative pediatric ROS (+)  Hematology negative hematology ROS (+)   Anesthesia Other Findings   Reproductive/Obstetrics negative OB ROS                           Anesthesia Physical Anesthesia Plan  ASA: I  Anesthesia Plan: General   Post-op Pain Management:    Induction: Intravenous  PONV Risk Score and Plan: 3 and Ondansetron, Dexamethasone, Treatment may vary due to age or medical condition and Scopolamine patch - Pre-op  Airway Management Planned: LMA  Additional Equipment:   Intra-op Plan:   Post-operative Plan: Extubation in OR  Informed Consent: I have reviewed the patients History and Physical, chart, labs and discussed the procedure including the risks, benefits and alternatives for the proposed anesthesia with the patient or authorized representative who has indicated his/her understanding and acceptance.   Dental advisory given  Plan Discussed with: CRNA and Surgeon  Anesthesia Plan Comments:        Anesthesia Quick Evaluation

## 2018-09-29 NOTE — Progress Notes (Signed)
Patient called the office this am requesting to have her prescription sent in today so she could have it ready after her procedure tomorrow.  Per Dr. C-P, only prescription needed would be for pain medication.  Discussed pain management after the procedure including taking Tylenol and ibuprofen.  Script for oxycodone sent to patient's pharmacy. No concerns voiced.  Advised to call for any needs.

## 2018-09-30 ENCOUNTER — Ambulatory Visit (HOSPITAL_BASED_OUTPATIENT_CLINIC_OR_DEPARTMENT_OTHER): Payer: BLUE CROSS/BLUE SHIELD | Admitting: Anesthesiology

## 2018-09-30 ENCOUNTER — Encounter (HOSPITAL_BASED_OUTPATIENT_CLINIC_OR_DEPARTMENT_OTHER): Payer: Self-pay | Admitting: *Deleted

## 2018-09-30 ENCOUNTER — Encounter (HOSPITAL_BASED_OUTPATIENT_CLINIC_OR_DEPARTMENT_OTHER)
Admission: RE | Disposition: A | Discharge status: Home / Self Care | Payer: Self-pay | Source: Ambulatory Visit | Attending: Gynecology

## 2018-09-30 ENCOUNTER — Ambulatory Visit (HOSPITAL_BASED_OUTPATIENT_CLINIC_OR_DEPARTMENT_OTHER)
Admission: RE | Admit: 2018-09-30 | Discharge: 2018-09-30 | Disposition: A | Discharge status: Home / Self Care | Payer: BLUE CROSS/BLUE SHIELD | Source: Ambulatory Visit | Attending: Gynecology | Admitting: Gynecology

## 2018-09-30 DIAGNOSIS — Z793 Long term (current) use of hormonal contraceptives: Secondary | ICD-10-CM | POA: Insufficient documentation

## 2018-09-30 DIAGNOSIS — E119 Type 2 diabetes mellitus without complications: Secondary | ICD-10-CM | POA: Insufficient documentation

## 2018-09-30 DIAGNOSIS — L720 Epidermal cyst: Secondary | ICD-10-CM | POA: Diagnosis not present

## 2018-09-30 DIAGNOSIS — N907 Vulvar cyst: Secondary | ICD-10-CM

## 2018-09-30 DIAGNOSIS — I10 Essential (primary) hypertension: Secondary | ICD-10-CM | POA: Insufficient documentation

## 2018-09-30 HISTORY — DX: Anemia, unspecified: D64.9

## 2018-09-30 HISTORY — DX: Other seasonal allergic rhinitis: J30.2

## 2018-09-30 HISTORY — PX: VULVECTOMY: SHX1086

## 2018-09-30 HISTORY — DX: Prediabetes: R73.03

## 2018-09-30 LAB — POCT I-STAT 4, (NA,K, GLUC, HGB,HCT)
GLUCOSE: 102 mg/dL — AB (ref 70–99)
HCT: 34 % — ABNORMAL LOW (ref 36.0–46.0)
HEMOGLOBIN: 11.6 g/dL — AB (ref 12.0–15.0)
POTASSIUM: 3.5 mmol/L (ref 3.5–5.1)
SODIUM: 142 mmol/L (ref 135–145)

## 2018-09-30 LAB — POCT PREGNANCY, URINE: PREG TEST UR: NEGATIVE

## 2018-09-30 SURGERY — WIDE EXCISION VULVECTOMY
Anesthesia: General

## 2018-09-30 MED ORDER — OXYCODONE HCL 5 MG PO TABS
5.0000 mg | ORAL_TABLET | Freq: Once | ORAL | Status: AC | PRN
  Administered 2018-09-30: 5 mg via ORAL
  Filled 2018-09-30: qty 1

## 2018-09-30 MED ORDER — KETOROLAC TROMETHAMINE 30 MG/ML IJ SOLN
INTRAMUSCULAR | Status: DC | PRN
  Administered 2018-09-30: 30 mg via INTRAVENOUS

## 2018-09-30 MED ORDER — MIDAZOLAM HCL 2 MG/2ML IJ SOLN
INTRAMUSCULAR | Status: AC
  Filled 2018-09-30: qty 2

## 2018-09-30 MED ORDER — OXYCODONE HCL 5 MG PO TABS
ORAL_TABLET | ORAL | Status: AC
  Filled 2018-09-30: qty 1

## 2018-09-30 MED ORDER — PROPOFOL 10 MG/ML IV BOLUS
INTRAVENOUS | Status: AC
  Filled 2018-09-30: qty 20

## 2018-09-30 MED ORDER — OXYCODONE HCL 5 MG/5ML PO SOLN
5.0000 mg | Freq: Once | ORAL | Status: AC | PRN
  Filled 2018-09-30: qty 5

## 2018-09-30 MED ORDER — LIDOCAINE HCL (PF) 1 % IJ SOLN
INTRAMUSCULAR | Status: DC | PRN
  Administered 2018-09-30: 2 mL

## 2018-09-30 MED ORDER — FENTANYL CITRATE (PF) 100 MCG/2ML IJ SOLN
INTRAMUSCULAR | Status: AC
  Filled 2018-09-30: qty 2

## 2018-09-30 MED ORDER — FENTANYL CITRATE (PF) 100 MCG/2ML IJ SOLN
25.0000 ug | INTRAMUSCULAR | Status: DC | PRN
  Administered 2018-09-30 (×2): 25 ug via INTRAVENOUS
  Filled 2018-09-30: qty 1

## 2018-09-30 MED ORDER — KETOROLAC TROMETHAMINE 30 MG/ML IJ SOLN
INTRAMUSCULAR | Status: AC
  Filled 2018-09-30: qty 1

## 2018-09-30 MED ORDER — FENTANYL CITRATE (PF) 100 MCG/2ML IJ SOLN
INTRAMUSCULAR | Status: DC | PRN
  Administered 2018-09-30 (×8): 25 ug via INTRAVENOUS

## 2018-09-30 MED ORDER — PROMETHAZINE HCL 25 MG/ML IJ SOLN
6.2500 mg | INTRAMUSCULAR | Status: DC | PRN
Start: 2018-09-30 — End: 2018-09-30
  Filled 2018-09-30: qty 1

## 2018-09-30 MED ORDER — ONDANSETRON HCL 4 MG/2ML IJ SOLN
INTRAMUSCULAR | Status: DC | PRN
  Administered 2018-09-30: 4 mg via INTRAVENOUS

## 2018-09-30 MED ORDER — PROPOFOL 10 MG/ML IV BOLUS
INTRAVENOUS | Status: DC | PRN
  Administered 2018-09-30: 180 mg via INTRAVENOUS
  Administered 2018-09-30: 20 mg via INTRAVENOUS

## 2018-09-30 MED ORDER — LIDOCAINE HCL (CARDIAC) PF 100 MG/5ML IV SOSY
PREFILLED_SYRINGE | INTRAVENOUS | Status: DC | PRN
  Administered 2018-09-30: 100 mg via INTRAVENOUS

## 2018-09-30 MED ORDER — DEXAMETHASONE SODIUM PHOSPHATE 10 MG/ML IJ SOLN
INTRAMUSCULAR | Status: DC | PRN
  Administered 2018-09-30: 10 mg via INTRAVENOUS

## 2018-09-30 MED ORDER — ONDANSETRON HCL 4 MG/2ML IJ SOLN
INTRAMUSCULAR | Status: AC
  Filled 2018-09-30: qty 2

## 2018-09-30 MED ORDER — LIDOCAINE HCL (CARDIAC) PF 100 MG/5ML IV SOSY
PREFILLED_SYRINGE | INTRAVENOUS | Status: AC
  Filled 2018-09-30: qty 5

## 2018-09-30 MED ORDER — ACETAMINOPHEN 10 MG/ML IV SOLN
1000.0000 mg | Freq: Once | INTRAVENOUS | Status: DC | PRN
  Filled 2018-09-30: qty 100

## 2018-09-30 MED ORDER — DEXAMETHASONE SODIUM PHOSPHATE 10 MG/ML IJ SOLN
INTRAMUSCULAR | Status: AC
  Filled 2018-09-30: qty 1

## 2018-09-30 MED ORDER — MIDAZOLAM HCL 5 MG/5ML IJ SOLN
INTRAMUSCULAR | Status: DC | PRN
  Administered 2018-09-30: 2 mg via INTRAVENOUS

## 2018-09-30 MED ORDER — LACTATED RINGERS IV SOLN
INTRAVENOUS | Status: DC
  Administered 2018-09-30 (×2): via INTRAVENOUS
  Filled 2018-09-30: qty 1000

## 2018-09-30 SURGICAL SUPPLY — 21 items
BLADE SURG 15 STRL LF DISP TIS (BLADE) ×1 IMPLANT
BLADE SURG 15 STRL SS (BLADE) ×1
CANISTER SUCT 3000ML PPV (MISCELLANEOUS) IMPLANT
CANISTER SUCTION 1200CC (MISCELLANEOUS) IMPLANT
CATH ROBINSON RED A/P 16FR (CATHETERS) IMPLANT
GAUZE 4X4 16PLY RFD (DISPOSABLE) ×2 IMPLANT
GLOVE BIO SURGEON STRL SZ7.5 (GLOVE) ×4 IMPLANT
GOWN STRL REUS W/TWL XL LVL3 (GOWN DISPOSABLE) ×2 IMPLANT
KIT TURNOVER CYSTO (KITS) ×2 IMPLANT
NEEDLE HYPO 25X1 1.5 SAFETY (NEEDLE) ×2 IMPLANT
NS IRRIG 500ML POUR BTL (IV SOLUTION) IMPLANT
PACK VAGINAL WOMENS (CUSTOM PROCEDURE TRAY) ×2 IMPLANT
PAD OB MATERNITY 4.3X12.25 (PERSONAL CARE ITEMS) ×2 IMPLANT
SCOPETTES 8  STERILE (MISCELLANEOUS) ×2
SCOPETTES 8 STERILE (MISCELLANEOUS) ×2 IMPLANT
SUT VIC AB 3-0 SH 27 (SUTURE) ×3
SUT VIC AB 3-0 SH 27X BRD (SUTURE) ×3 IMPLANT
SUT VIC AB 4-0 SH 27 (SUTURE) ×2
SUT VIC AB 4-0 SH 27XANBCTRL (SUTURE) ×2 IMPLANT
TOWEL OR 17X24 6PK STRL BLUE (TOWEL DISPOSABLE) ×4 IMPLANT
WATER STERILE IRR 500ML POUR (IV SOLUTION) ×2 IMPLANT

## 2018-09-30 NOTE — Transfer of Care (Signed)
Immediate Anesthesia Transfer of Care Note  Patient: Linda Maldonado  Procedure(s) Performed: Procedure(s) (LRB): WIDE LOCAL EXCISION VULVAR (N/A)  Patient Location: PACU  Anesthesia Type: General  Level of Consciousness: awake, sedated, patient cooperative and responds to stimulation  Airway & Oxygen Therapy: Patient Spontanous Breathing and Patient connected to [place] O2  Post-op Assessment: Report given to PACU RN, Post -op Vital signs reviewed and stable and Patient moving all extremities  Post vital signs: Reviewed and stable  Complications: No apparent anesthesia complications

## 2018-09-30 NOTE — Interval H&P Note (Signed)
History and Physical Interval Note:  09/30/2018 8:31 AM  Linda Maldonado  has presented today for surgery, with the diagnosis of vulvar cyst  The various methods of treatment have been discussed with the patient and family. After consideration of risks, benefits and other options for treatment, the patient has consented to  Procedure(s): WIDE LOCAL EXCISION VULVAR (N/A) as a surgical intervention .  The patient's history has been reviewed, patient examined, no change in status, stable for surgery.  I have reviewed the patient's chart and labs.  Questions were answered to the patient's satisfaction.     De Blanch

## 2018-09-30 NOTE — Anesthesia Postprocedure Evaluation (Signed)
Anesthesia Post Note  Patient: Linda Maldonado  Procedure(s) Performed: WIDE LOCAL EXCISION VULVAR (N/A )     Patient location during evaluation: PACU Anesthesia Type: General Level of consciousness: awake and alert Pain management: pain level controlled Vital Signs Assessment: post-procedure vital signs reviewed and stable Respiratory status: spontaneous breathing, nonlabored ventilation and respiratory function stable Cardiovascular status: blood pressure returned to baseline and stable Postop Assessment: no apparent nausea or vomiting Anesthetic complications: no    Last Vitals:  Vitals:   09/30/18 1015 09/30/18 1030  BP: (!) 125/91 131/78  Pulse: 60 60  Resp: 13 13  Temp: 36.8 C   SpO2: 100% 100%    Last Pain:  Vitals:   09/30/18 1030  TempSrc:   PainSc: 5                  Kaylyn Layer

## 2018-09-30 NOTE — Anesthesia Procedure Notes (Signed)
Procedure Name: LMA Insertion Date/Time: 09/30/2018 9:05 AM Performed by: Tyrone Nine, CRNA Pre-anesthesia Checklist: Patient identified, Timeout performed, Emergency Drugs available, Suction available and Patient being monitored Patient Re-evaluated:Patient Re-evaluated prior to induction Oxygen Delivery Method: Circle system utilized Preoxygenation: Pre-oxygenation with 100% oxygen Induction Type: IV induction Ventilation: Mask ventilation without difficulty LMA: LMA inserted LMA Size: 4.0 Number of attempts: 1 Placement Confirmation: CO2 detector,  positive ETCO2 and breath sounds checked- equal and bilateral Tube secured with: Tape Dental Injury: Teeth and Oropharynx as per pre-operative assessment

## 2018-09-30 NOTE — Discharge Instructions (Addendum)
09/30/2018  Return to work: 2-4 weeks if applicable  Activity: 1. Be up and out of the bed during the day.  Take a nap if needed.  You may walk up steps but be careful and use the hand rail.  Stair climbing will tire you more than you think, you may need to stop part way and rest.   2. No lifting or straining for 6 weeks.  3. Do not drive if you are taking narcotic pain medicine.  4. Shower daily.  Pat your incision dry, don't rub.  No tub baths until cleared by your surgeon.   5. No sexual activity and nothing in the vagina for 6 weeks.  6. You may experience a small amount of clear drainage from your incisions, which is normal.  If the drainage persists or increases, please call the office.  7. Take Tylenol or ibuprofen first for pain and only use oxycodone for severe pain not relieved by the Tylenol or Ibuprofen.  Monitor your Tylenol intake to a max of 4,000 mg a day.  Diet: 1. Low sodium Heart Healthy Diet is recommended.  2. It is safe to use a laxative, such as Miralax or Colace, if you have difficulty moving your bowels. You can take Sennakot at bedtime every evening to keep bowel movements regular and to prevent constipation.    Wound Care: 1. Keep clean and dry.  Shower daily.  Reasons to call the Doctor:  Fever - Oral temperature greater than 100.4 degrees Fahrenheit  Foul-smelling vaginal discharge  Difficulty urinating  Nausea and vomiting  Increased pain at the site of the incision that is unrelieved with pain medicine.  Difficulty breathing with or without chest pain  New calf pain especially if only on one side  Sudden, continuing increased vaginal bleeding with or without clots.   Contacts: For questions or concerns you should contact:  Dr. De Blanch at [phone] or [phone]  Warner Mccreedy, NP at [phone]  After Hours: call [phone] and have the GYN Oncologist paged/contacted   Vulvectomy, Care After  Refer to this  sheet in the next few weeks. These instructions provide you with information about caring for yourself after your procedure. Your health care provider may also give you more specific instructions. Your treatment has been planned according to current medical practices, but problems sometimes occur. Call your health care provider if you have any problems or questions after your procedure. What can I expect after the procedure? After the procedure, it is common to have:  Vaginal pain.  Vaginal numbness.  Vaginal swelling.  Bloody vaginal discharge.  Follow these instructions at home: Activity   Rest as told by your health care provider.  Do not lift, push, or pull more than 5 lb (2.3 kg).  Avoid activities that require a lot of energy for as long as told by your health care provider. This includes any exercise.  Raise (elevate) your legs while sitting or lying down.  Avoid standing or sitting in one place for long periods of time.  Do not cross your legs, especially when sitting. Bathing  Do not take baths, swim, or use a hot tub until your health care provider approves. Ask your health care provider if you can take showers. You may only be allowed to take sponge baths for bathing.  After passing urine or a bowel movement, wipe yourself from front to back and clean your vaginal area using a spray bottle. Incision care   Follow instructions from your health  care provider about how to take care of your incision. Make sure you: ? Wash your hands with soap and water before you change your bandage (dressing). If soap and water are not available, use hand sanitizer. ? Change your dressing as told by your health care provider. ? Leave stitches (sutures), skin glue, adhesive strips, or surgical clips in place. These skin closures may need to stay in place for 2 weeks or longer. If adhesive strip edges start to loosen and curl up, you may trim the loose edges. Do not remove adhesive strips  completely unless your health care provider tells you to do that.  Check your incision area every day for signs of infection. Check for: ? More redness, swelling, or pain. ? More fluid or blood. ? Warmth. ? Pus or a bad smell. Lifestyle  Do not douche or use tampons until your health care provider approves.  Do not have sex until your health care provider approves. Tell your health care provider if you have pain or numbness when you return to sexual activity.  Wear comfortable, loose-fitting clothing. Driving  Do not drive or operate heavy machinery while taking prescription pain medicine.  Do not drive for 24 hours if you received a medicine to help you relax (sedative). General instructions   Take over-the-counter and prescription medicines only as told by your health care provider.  Take a stool softener to help prevent constipation as told by your health care provider. You may need to do this if you are taking prescription pain medicine.  Drink enough fluid to keep your urine clear or pale yellow. Contact a health care provider if:  You have more redness, swelling, or pain around your incision.  You have more fluid or blood coming from your incision.  Your incision feels warm to the touch.  You have pus or a bad smell coming from your incision.  You have a fever.  You have painful or bloody urination.  You feel nauseous or you vomit.  You develop diarrhea.  You develop constipation.  You develop a rash.  You feel dizzy or light-headed.  You have pain that does not get better with medicine.  Your incision breaks open. Get help right away if:  You faint.  You develop leg or chest pain.  You develop abdominal pain.  You develop shortness of breath. This information is not intended to replace advice given to you by your health care provider. Make sure you discuss any questions you have with your health care provider. Document Released: 07/24/2004  Document Revised: 05/17/2016 Document Reviewed: 12/05/2015 Elsevier Interactive Patient Education  2018 [employer].  Oxycodone tablets or capsules What is this medicine? OXYCODONE (ox i KOE done) is a pain reliever. It is used to treat moderate to severe pain. This medicine may be used for other purposes; ask your health care provider or pharmacist if you have questions. COMMON BRAND NAME(S): Dazidox, Endocodone, Oxaydo, OXECTA, OxyIR, Percolone, Roxicodone, ROXYBOND What should I tell my health care provider before I take this medicine? They need to know if you have any of these conditions: -Addison's disease -brain tumor -head injury -heart disease -history of drug or alcohol abuse problem -if you often drink alcohol -kidney disease -liver disease -lung or breathing disease, like asthma -mental illness -pancreatic disease -seizures -thyroid disease -an unusual or allergic reaction to oxycodone, codeine, hydrocodone, morphine, other medicines, foods, dyes, or preservatives -pregnant or trying to get pregnant -breast-feeding How should I use this medicine? Take this  medicine by mouth with a glass of water. Follow the directions on the prescription label. You can take it with or without food. If it upsets your stomach, take it with food. Take your medicine at regular intervals. Do not take it more often than directed. Do not stop taking except on your doctor's advice. Some brands of this medicine, like Oxecta, have special instructions. Ask your doctor or pharmacist if these directions are for you: Do not cut, crush or chew this medicine. Swallow only one tablet at a time. Do not wet, soak, or lick the tablet before you take it. A special MedGuide will be given to you by the pharmacist with each prescription and refill. Be sure to read this information carefully each time. Talk to your pediatrician regarding the use of this medicine in children. Special care may be  needed. Overdosage: If you think you have taken too much of this medicine contact a poison control center or emergency room at once. NOTE: This medicine is only for you. Do not share this medicine with others. What if I miss a dose? If you miss a dose, take it as soon as you can. If it is almost time for your next dose, take only that dose. Do not take double or extra doses. What may interact with this medicine? This medicine may interact with the following medications: -alcohol -antihistamines for allergy, cough and cold -antiviral medicines for HIV or AIDS -atropine -certain antibiotics like clarithromycin, erythromycin, linezolid, rifampin -certain medicines for anxiety or sleep -certain medicines for bladder problems like oxybutynin, tolterodine -certain medicines for depression like amitriptyline, fluoxetine, sertraline -certain medicines for fungal infections like ketoconazole, itraconazole, voriconazole -certain medicines for migraine headache like almotriptan, eletriptan, frovatriptan, naratriptan, rizatriptan, sumatriptan, zolmitriptan -certain medicines for nausea or vomiting like dolasetron, ondansetron, palonosetron -certain medicines for Parkinson's disease like benztropine, trihexyphenidyl -certain medicines for seizures like phenobarbital, phenytoin, primidone -certain medicines for stomach problems like dicyclomine, hyoscyamine -certain medicines for travel sickness like scopolamine -diuretics -general anesthetics like halothane, isoflurane, methoxyflurane, propofol -ipratropium -local anesthetics like lidocaine, pramoxine, tetracaine -MAOIs like Carbex, Eldepryl, Marplan, Nardil, and Parnate -medicines that relax muscles for surgery -methylene blue -nilotinib -other narcotic medicines for pain or cough -phenothiazines like chlorpromazine, mesoridazine, prochlorperazine, thioridazine This list may not describe all possible interactions. Give your health care provider a  list of all the medicines, herbs, non-prescription drugs, or dietary supplements you use. Also tell them if you smoke, drink alcohol, or use illegal drugs. Some items may interact with your medicine. What should I watch for while using this medicine? Tell your doctor or health care professional if your pain does not go away, if it gets worse, or if you have new or a different type of pain. You may develop tolerance to the medicine. Tolerance means that you will need a higher dose of the medicine for pain relief. Tolerance is normal and is expected if you take this medicine for a long time. Do not suddenly stop taking your medicine because you may develop a severe reaction. Your body becomes used to the medicine. This does NOT mean you are addicted. Addiction is a behavior related to getting and using a drug for a non-medical reason. If you have pain, you have a medical reason to take pain medicine. Your doctor will tell you how much medicine to take. If your doctor wants you to stop the medicine, the dose will be slowly lowered over time to avoid any side effects. There are different types of  narcotic medicines (opiates). If you take more than one type at the same time or if you are taking another medicine that also causes drowsiness, you may have more side effects. Give your health care provider a list of all medicines you use. Your doctor will tell you how much medicine to take. Do not take more medicine than directed. Call emergency for help if you have problems breathing or unusual sleepiness. You may get drowsy or dizzy. Do not drive, use machinery, or do anything that needs mental alertness until you know how the medicine affects you. Do not stand or sit up quickly, especially if you are an older patient. This reduces the risk of dizzy or fainting spells. Alcohol may interfere with the effect of this medicine. Avoid alcoholic drinks. This medicine will cause constipation. Try to have a bowel movement at  least every 2 to 3 days. If you do not have a bowel movement for 3 days, call your doctor or health care professional. Your mouth may get dry. Chewing sugarless gum or sucking hard candy, and drinking plenty of water may help. Contact your doctor if the problem does not go away or is severe. What side effects may I notice from receiving this medicine? Side effects that you should report to your doctor or health care professional as soon as possible: -allergic reactions like skin rash, itching or hives, swelling of the face, lips, or tongue -breathing problems -confusion -signs and symptoms of low blood pressure like dizziness; feeling faint or lightheaded, falls; unusually weak or tired -trouble passing urine or change in the amount of urine -trouble swallowing Side effects that usually do not require medical attention (report to your doctor or health care professional if they continue or are bothersome): -constipation -dry mouth -nausea, vomiting -tiredness This list may not describe all possible side effects. Call your doctor for medical advice about side effects. You may report side effects to FDA at 1-800-FDA-1088. Where should I keep my medicine? Keep out of the reach of children. This medicine can be abused. Keep your medicine in a safe place to protect it from theft. Do not share this medicine with anyone. Selling or giving away this medicine is dangerous and against the law. Store at room temperature between 15 and 30 degrees C (59 and 86 degrees F). Protect from light. Keep container tightly closed. This medicine may cause accidental overdose and death if it is taken by other adults, children, or pets. Flush any unused medicine down the toilet to reduce the chance of harm. Do not use the medicine after the expiration date. NOTE: This sheet is a summary. It may not cover all possible information. If you have questions about this medicine, talk to your doctor, pharmacist, or health care  provider.  2018 Elsevier/Gold Standard (2015-10-25 16:55:57)   Post Anesthesia Home Care Instructions  Activity: Get plenty of rest for the remainder of the day. A responsible adult should stay with you for 24 hours following the procedure.  For the next 24 hours, DO NOT: -Drive a car -Advertising copywriter -Drink alcoholic beverages -Take any medication unless instructed by your physician -Make any legal decisions or sign important papers.  Meals: Start with liquid foods such as gelatin or soup. Progress to regular foods as tolerated. Avoid greasy, spicy, heavy foods. If nausea and/or vomiting occur, drink only clear liquids until the nausea and/or vomiting subsides. Call your physician if vomiting continues.  Special Instructions/Symptoms: Your throat may feel dry or sore from the anesthesia  or the breathing tube placed in your throat during surgery. If this causes discomfort, gargle with warm salt water. The discomfort should disappear within 24 hours.  If you had a scopolamine patch placed behind your ear for the management of post- operative nausea and/or vomiting:  1. The medication in the patch is effective for 72 hours, after which it should be removed.  Wrap patch in a tissue and discard in the trash. Wash hands thoroughly with soap and water. 2. You may remove the patch earlier than 72 hours if you experience unpleasant side effects which may include dry mouth, dizziness or visual disturbances. 3. Avoid touching the patch. Wash your hands with soap and water after contact with the patch.

## 2018-09-30 NOTE — Op Note (Signed)
Linda Maldonado  female MEDICAL RECORD [id number] DATE OF BIRTH: [birth year] PHYSICIAN: De Blanch, M.D  09/30/2018   OPERATIVE REPORT  PREOPERATIVE DIAGNOSIS: Bilateral vulvar cystsbilateral vulvar cysts  POSTOPERATIVE DIAGNOSIS: Same pending final pathologysame pending final pathology  PROCEDURE: Lesion of bilateral vulvar cysts (partial simple vulvectomy)excision of bilateral vulvar cyst (pa  SURGEON: De Blanch, M.D   ESTIMATED BLOOD LOSS: 50 mL50 mL  SURGICAL FINDINGS: Beneath the left labia minora were 2 cysts the largest measuring approximately 3 cm in diameter. beneath the left labium were 2 cysts the l largest being 3 cm.  There was a 2 cm cyst beneath the right labia minora.a 2 cm cyst beneath the right labia minora.  PROCEDURE:   The patient was brought to the operating room and placed in lithotomy position.Marland Kitchen LMA anesthesia was achieved. A surgical timeout was taken.  The vulva was prepped and draped.  An incision lateral to the left labia minora was made and the cysts resected with care to preserve the labia minora.  Hemostasis was achieved with cautery and sutures of 3-0 vicryl.  The skin was reapproximated with 4-0 vicryl. A similar procedure was performed on the right.  The patient was awakened from anesthesia and taken to the PACU in stable condition.  Sponge, needle and instrument counts were correct.  De Blanch, M.D

## 2018-10-01 ENCOUNTER — Telehealth: Payer: Self-pay | Admitting: Oncology

## 2018-10-01 ENCOUNTER — Encounter (HOSPITAL_BASED_OUTPATIENT_CLINIC_OR_DEPARTMENT_OTHER): Payer: Self-pay | Admitting: Gynecology

## 2018-10-01 NOTE — Telephone Encounter (Signed)
Called Linda Maldonado and asked how she is feeling.  She said that she feels OK today.  She did take Ibuprofen this morning and did have trouble sleeping last night.  She also mentioned that her 40 year old has hand foot and mouth disease.  Advised her to practice good hand washing.  Also notified her that of her biopsy results per Warner Mccreedy, NP which where benign.  She verbalized agreement.

## 2018-10-14 ENCOUNTER — Inpatient Hospital Stay: Payer: BLUE CROSS/BLUE SHIELD | Attending: Gynecology | Admitting: Gynecology

## 2018-10-14 ENCOUNTER — Encounter: Payer: Self-pay | Admitting: Gynecology

## 2018-10-14 VITALS — BP 127/87 | HR 90 | Temp 98.6°F | Resp 18 | Ht 64.0 in | Wt 183.0 lb

## 2018-10-14 DIAGNOSIS — N907 Vulvar cyst: Secondary | ICD-10-CM

## 2018-10-14 DIAGNOSIS — Z9889 Other specified postprocedural states: Secondary | ICD-10-CM | POA: Diagnosis not present

## 2018-10-14 NOTE — Progress Notes (Signed)
Consult Note: Gyn-Onc   Linda Maldonado 42 y.o. female    Assessment : Status post excision of bilateral epithelial cysts of the labia minora.  Very good healing to date. Plan: The patient is already returned to work.  There is okay with me if she returns to the gym for weight lifting but would avoid contact to the vulva by riding a bike.  She return to see me in 4 to 6 weeks for final postoperative check.   Interval history: Patient returns today for postoperative follow-up having undergone excision of bilateral labia minora cysts on September 30, 2018.  Final pathology showed these to be benign epithelial lined cysts.  She reports she is doing well.  She returned to work and is having no pain bleeding or discharge from the vulva.   HPI: 42 year old female gravida 4 seen in consultation request of Dr.Susan Almquist regarding management of bilateral vulvar cystic lesions.  The patient reports that the cyst first occurred during pregnancy 16 years ago.  The cyst over time have gradually gotten larger and more symptomatic.  On some days they are painful.  There also bothersome when the patient exercises or wears tight clothing.  Ultrasound has been obtained of the vulva showing 2 cysts on the left side one measuring 1.6 x 0.7 x 1 cm and the other measuring 2.7 x 1.8 x 2.4 cm.  The largest cyst seems to have a solid-appearing characteristics with internal echoes.  On the right side is a 1.8 x 0.9 x 1.3 cm cyst.  Patient has regular cyclic menses.  She is not currently using any contraception.  She has no other medical problems.  She underwent excision of bilateral labia minora cysts on September 30, 2018.  Final pathology showed these to be benign epithelial lined cysts.    Review of Systems:10 point review of systems is negative except as noted in interval history.   Vitals: Blood pressure 127/87, pulse 90, temperature 98.6 F (37 C), temperature source Oral, resp. rate 18, height 5\' 4"  (1.626  m), weight 183 lb (83 kg), last menstrual period 09/22/2018, SpO2 99 %, unknown if currently breastfeeding.  Physical Exam: General : The patient is a healthy woman in no acute distress.  HEENT: normocephalic, extraoccular movements normal; neck is supple without thyromegally  Lynphnodes: Supraclavicular and inguinal nodes not enlarged  Abdomen: Soft, non-tender, no ascites, no organomegally, no masses, no hernias  Pelvic:  EGBUS: All incisions are healing well.  There is no evidence of infection or wound separation.  There is still some Vicryl sutures present.    Lower extremities: No edema or varicosities. Normal range of motion      No Known Allergies  Past Medical History:  Diagnosis Date  . Anemia   . Bartholin cyst    Bilateral  . Chiari Frommel syndrome   . Dysmenorrhea   . External hemorrhoids   . Follicular cyst of ovary   . Gestational diabetes 1996  . High cholesterol   . Menorrhagia   . Pre-diabetes   . Pregnancy induced hypertension   . Seasonal allergies   . Vulvar cysts   . Vulvar mass     Past Surgical History:  Procedure Laterality Date  . DILATION AND CURETTAGE OF UTERUS  1999  . VULVECTOMY N/A 09/30/2018   Procedure: WIDE LOCAL EXCISION VULVAR;  Surgeon: De Blanch, MD;  Location: [facility];  Service: Gynecology;  Laterality: N/A;  . WISDOM TOOTH EXTRACTION  2009  Current Outpatient Medications  Medication Sig Dispense Refill  . Acetaminophen (MIDOL PO) Take by mouth.    . doxylamine, Sleep, (UNISOM) 25 MG tablet Take 25 mg by mouth at bedtime as needed.    Marland Kitchen ibuprofen (ADVIL,MOTRIN) 200 MG tablet Take 200 mg by mouth every 6 (six) hours as needed.    . triamcinolone (NASACORT ALLERGY 24HR) 55 MCG/ACT AERO nasal inhaler Place 2 sprays into the nose daily.    . Norethindrone Acetate-Ethinyl Estrad-FE (JUNEL FE 24) 1-20 MG-MCG(24) tablet Take 1 tablet by mouth daily.    Marland Kitchen oxyCODONE (OXY IR/ROXICODONE) 5 MG immediate  release tablet Take 1 tablet (5 mg total) by mouth every 4 (four) hours as needed for severe pain. (Patient not taking: Reported on 10/14/2018) 20 tablet 0   No current facility-administered medications for this visit.     Social History   Socioeconomic History  . Marital status: Single    Spouse name: Not on file  . Number of children: Not on file  . Years of education: Not on file  . Highest education level: Not on file  Occupational History  . Not on file  Social Needs  . Financial resource strain: Not on file  . Food insecurity:    Worry: Not on file    Inability: Not on file  . Transportation needs:    Medical: Not on file    Non-medical: Not on file  Tobacco Use  . Smoking status: Never Smoker  . Smokeless tobacco: Never Used  Substance and Sexual Activity  . Alcohol use: Yes    Comment: Occasional  . Drug use: Not Currently    Types: Marijuana    Comment: Ocassional  . Sexual activity: Yes    Birth control/protection: Pill  Lifestyle  . Physical activity:    Days per week: Not on file    Minutes per session: Not on file  . Stress: Not on file  Relationships  . Social connections:    Talks on phone: Not on file    Gets together: Not on file    Attends religious service: Not on file    Active member of club or organization: Not on file    Attends meetings of clubs or organizations: Not on file    Relationship status: Not on file  . Intimate partner violence:    Fear of current or ex partner: Not on file    Emotionally abused: Not on file    Physically abused: Not on file    Forced sexual activity: Not on file  Other Topics Concern  . Not on file  Social History Narrative  . Not on file    Family History  Problem Relation Age of Onset  . Stroke Mother   . Diabetes Maternal Grandmother   . Breast cancer Cousin   . Diabetes Other       De Blanch, MD 10/14/2018, 9:45 AM

## 2018-10-14 NOTE — Patient Instructions (Signed)
Plan to follow up with Dr. Stanford Breed on November 18, 2018 or sooner if needed.  Please call the office for any needs.

## 2018-11-14 DIAGNOSIS — H6092 Unspecified otitis externa, left ear: Secondary | ICD-10-CM | POA: Diagnosis not present

## 2018-11-14 DIAGNOSIS — Z6833 Body mass index (BMI) 33.0-33.9, adult: Secondary | ICD-10-CM | POA: Diagnosis not present

## 2018-11-18 ENCOUNTER — Inpatient Hospital Stay: Payer: BLUE CROSS/BLUE SHIELD | Attending: Gynecology | Admitting: Gynecology

## 2018-11-18 ENCOUNTER — Encounter: Payer: Self-pay | Admitting: Gynecology

## 2018-11-18 VITALS — BP 112/62 | HR 93 | Temp 98.3°F | Resp 20 | Ht 64.0 in | Wt 189.0 lb

## 2018-11-18 DIAGNOSIS — N907 Vulvar cyst: Secondary | ICD-10-CM | POA: Diagnosis not present

## 2018-11-18 NOTE — Progress Notes (Signed)
Consult Note: Gyn-Onc   Linda Maldonado 42 y.o. female    Assessment : Status post excision of bilateral epithelial cysts of the labia minora.   Excellent postoperative recovery and cosmetic result. Plan: The patient is given the okay to return to full levels of activity including sexual intercourse.  She return to the care of her primary care gynecologist.  I endorsed the concept of discontinuing her birth control pills given her severe headaches.  Alternative forms of contraception including Mirena IUD, copper T, or Depo-Provera were discussed but no decisions were made.   Interval history: Patient returns today for scheduled postoperative follow-up.  She reports that she is feeling well and had no vulvar symptoms.   HPI: 42 year old female gravida 4 seen in consultation request of Dr.Susan Almquist regarding management of bilateral vulvar cystic lesions.  The patient reports that the cyst first occurred during pregnancy 16 years ago.  The cyst over time have gradually gotten larger and more symptomatic.  On some days they are painful.  There also bothersome when the patient exercises or wears tight clothing.  Ultrasound has been obtained of the vulva showing 2 cysts on the left side one measuring 1.6 x 0.7 x 1 cm and the other measuring 2.7 x 1.8 x 2.4 cm.  The largest cyst seems to have a solid-appearing characteristics with internal echoes.  On the right side is a 1.8 x 0.9 x 1.3 cm cyst.  Patient has regular cyclic menses.  She is not currently using any contraception.  She has no other medical problems.  She underwent excision of bilateral labia minora cysts on September 30, 2018.  Final pathology showed these to be benign epithelial lined cysts.    Review of Systems:10 point review of systems is negative except as noted in interval history.   Vitals: Blood pressure 112/62, pulse 93, temperature 98.3 F (36.8 C), temperature source Oral, resp. rate 20, height 5\' 4"  (1.626 m), weight  189 lb (85.7 kg), SpO2 100 %, unknown if currently breastfeeding.  Physical Exam: General : The patient is a healthy woman in no acute distress.  HEENT: normocephalic, extraoccular movements normal; neck is supple without thyromegally  Lynphnodes: Supraclavicular and inguinal nodes not enlarged  Abdomen: Soft, non-tender, no ascites, no organomegally, no masses, no hernias  Pelvic:  EGBUS: The vulva appears normal and healed well.  No lesions are noted..       Lower extremities: No edema or varicosities. Normal range of motion      No Known Allergies  Past Medical History:  Diagnosis Date  . Anemia   . Bartholin cyst    Bilateral  . Chiari Frommel syndrome   . Dysmenorrhea   . External hemorrhoids   . Follicular cyst of ovary   . Gestational diabetes 1996  . High cholesterol   . Menorrhagia   . Pre-diabetes   . Pregnancy induced hypertension   . Seasonal allergies   . Vulvar cysts   . Vulvar mass     Past Surgical History:  Procedure Laterality Date  . DILATION AND CURETTAGE OF UTERUS  1999  . VULVECTOMY N/A 09/30/2018   Procedure: WIDE LOCAL EXCISION VULVAR;  Surgeon: De Blanch, MD;  Location: [facility];  Service: Gynecology;  Laterality: N/A;  . WISDOM TOOTH EXTRACTION  2009    Current Outpatient Medications  Medication Sig Dispense Refill  . APPLE CIDER VINEGAR PO Take 2 tablets by mouth daily.    . cholecalciferol (VITAMIN D3) 25 MCG (  1000 UT) tablet Take 1,000 Units by mouth daily.    . Coenzyme Q10 (COQ-10 PO) Take 1 tablet by mouth.    . diphenhydrAMINE (BENADRYL) 25 MG tablet Take 25 mg by mouth at bedtime as needed. Patient states that she takes 2 tablets    . FLAXSEED, LINSEED, PO Take 1 tablet by mouth.    Marland Kitchen. ibuprofen (ADVIL,MOTRIN) 200 MG tablet Take 200 mg by mouth every 6 (six) hours as needed.    . Prenatal Multivit-Min-Fe-FA (PRENATAL VITAMINS PO) Take 1 tablet by mouth.    . Red Yeast Rice Extract (RED YEAST RICE PO)  Take 2 tablets by mouth daily.    Marland Kitchen. triamcinolone (NASACORT ALLERGY 24HR) 55 MCG/ACT AERO nasal inhaler Place 2 sprays into the nose as needed.     . Norethindrone Acetate-Ethinyl Estrad-FE (JUNEL FE 24) 1-20 MG-MCG(24) tablet Take 1 tablet by mouth daily.     No current facility-administered medications for this visit.     Social History   Socioeconomic History  . Marital status: Single    Spouse name: Not on file  . Number of children: Not on file  . Years of education: Not on file  . Highest education level: Not on file  Occupational History  . Not on file  Social Needs  . Financial resource strain: Not on file  . Food insecurity:    Worry: Not on file    Inability: Not on file  . Transportation needs:    Medical: Not on file    Non-medical: Not on file  Tobacco Use  . Smoking status: Never Smoker  . Smokeless tobacco: Never Used  Substance and Sexual Activity  . Alcohol use: Yes    Comment: Occasional  . Drug use: Not Currently    Types: Marijuana    Comment: Ocassional  . Sexual activity: Yes    Birth control/protection: Pill  Lifestyle  . Physical activity:    Days per week: Not on file    Minutes per session: Not on file  . Stress: Not on file  Relationships  . Social connections:    Talks on phone: Not on file    Gets together: Not on file    Attends religious service: Not on file    Active member of club or organization: Not on file    Attends meetings of clubs or organizations: Not on file    Relationship status: Not on file  . Intimate partner violence:    Fear of current or ex partner: Not on file    Emotionally abused: Not on file    Physically abused: Not on file    Forced sexual activity: Not on file  Other Topics Concern  . Not on file  Social History Narrative  . Not on file    Family History  Problem Relation Age of Onset  . Stroke Mother   . Diabetes Maternal Grandmother   . Breast cancer Cousin   . Diabetes Other       De Blanchaniel  Clarke-Pearson, MD 11/18/2018, 3:10 PM

## 2018-11-18 NOTE — Patient Instructions (Signed)
You are released to follow-up as before with your primary Ob/Gyn provider, including further discussion of birth control options.  If needed in future, our number is [phone].

## 2018-12-23 DIAGNOSIS — Z23 Encounter for immunization: Secondary | ICD-10-CM | POA: Diagnosis not present

## 2019-01-07 IMAGING — US US PELVIS LIMITED
1 series · 10 of 10 positions shown · non-contrast
Comparison: None.

CLINICAL DATA: Bartholin's cyst

EXAM:
LIMITED ULTRASOUND OF PELVIS
TECHNIQUE: Limited transabdominal ultrasound examination of the pelvis was
performed.

[Series 1: us pelvis limited · 0.06mm/px · 10 of 10 slices shown]
[im 1/10]
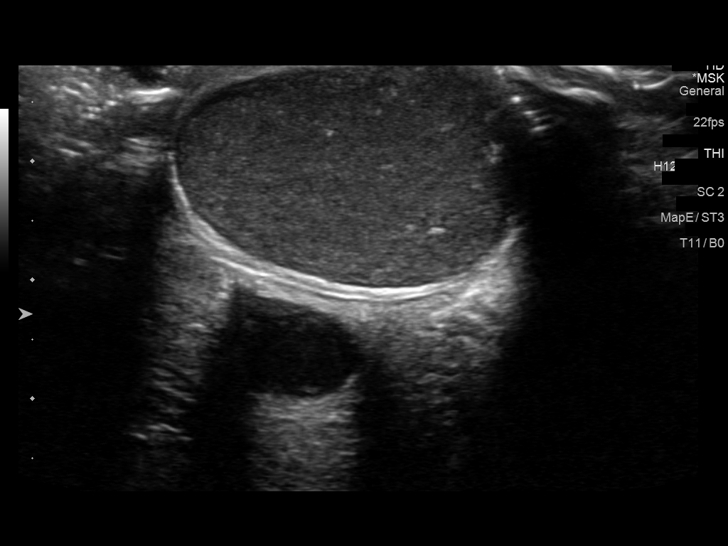
[im 2/10]
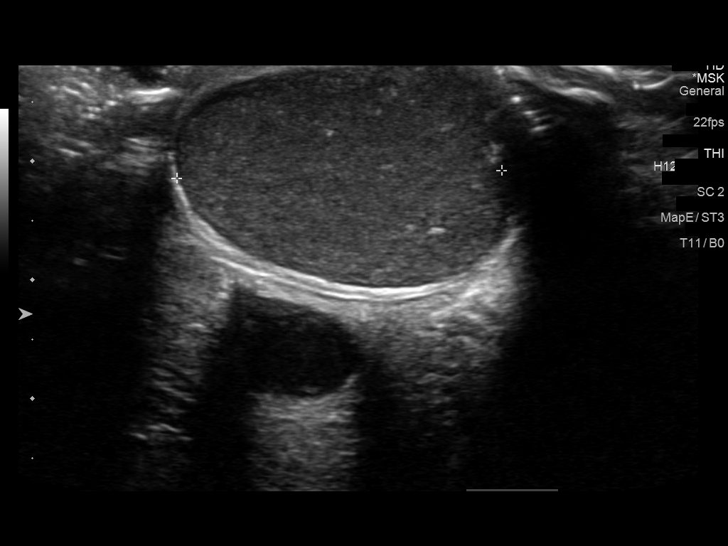
[im 3/10]
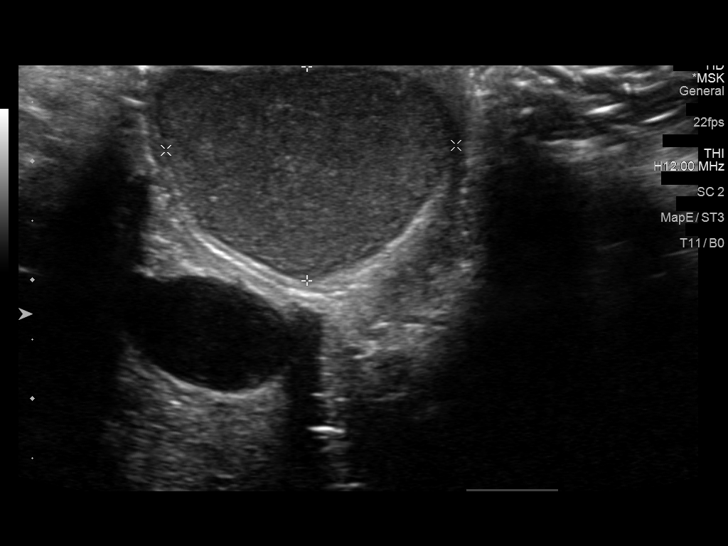
[im 4/10]
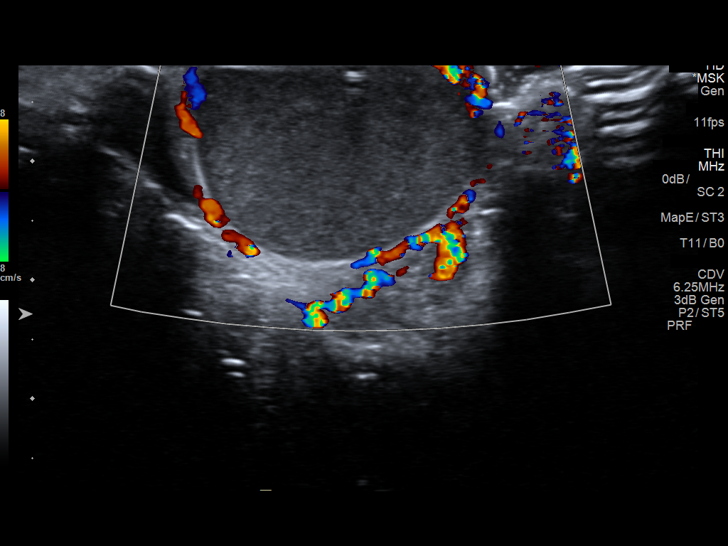
[im 5/10]
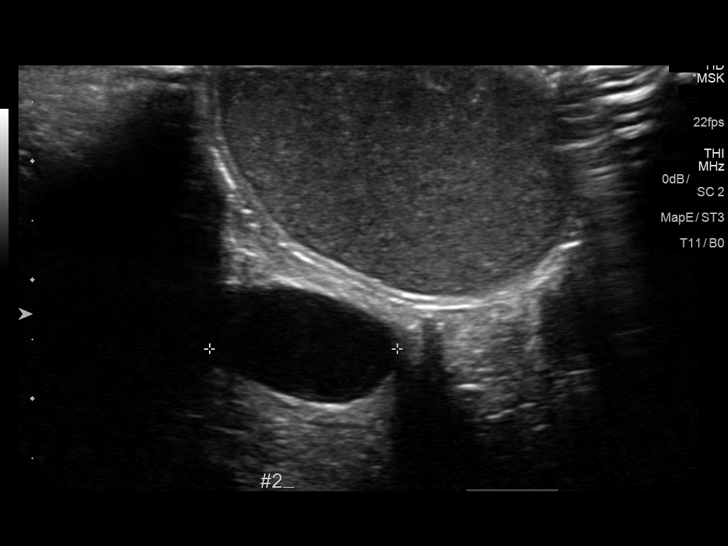
[im 6/10]
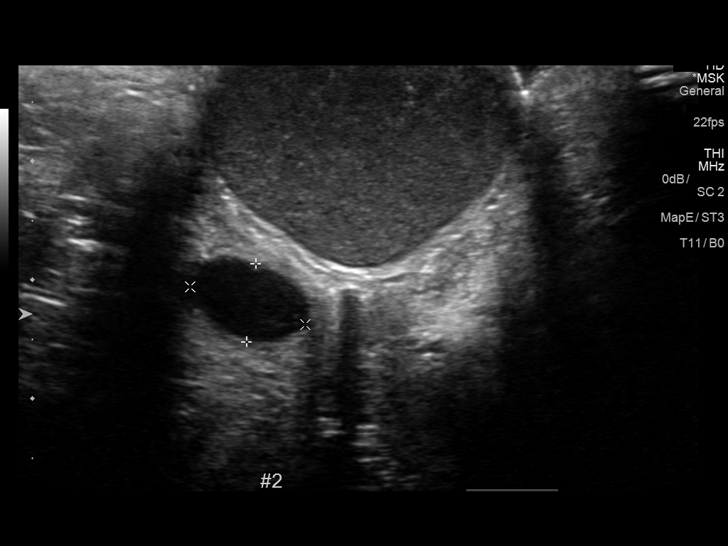
[im 7/10]
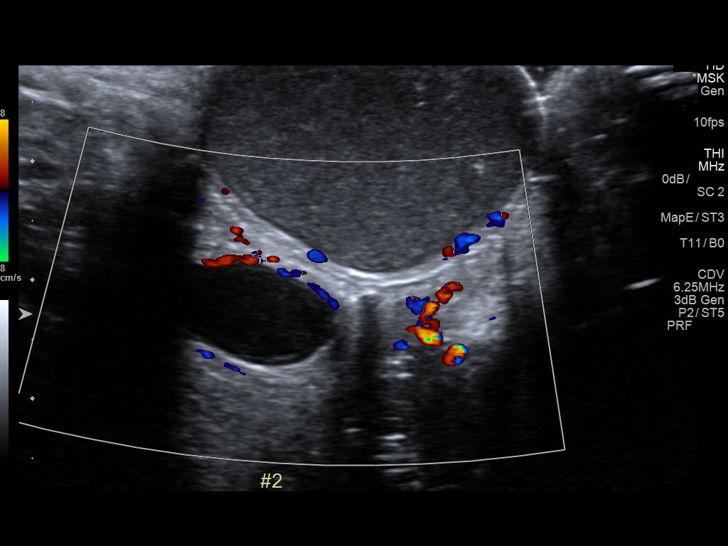
[im 8/10]
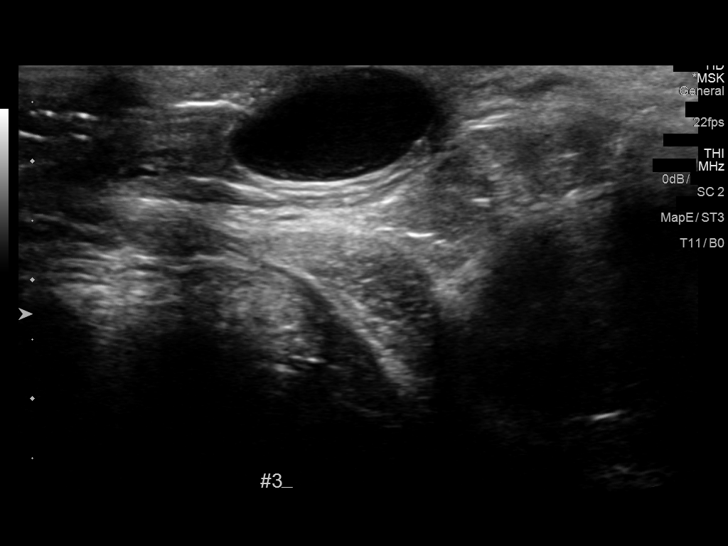
[im 9/10]
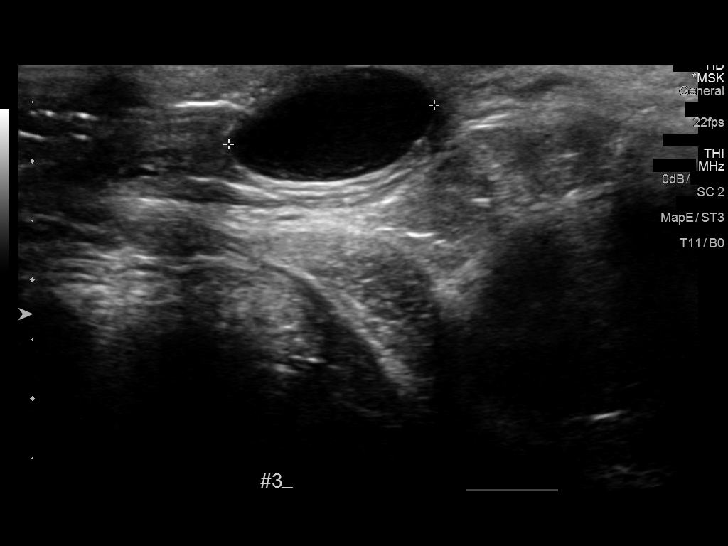
[im 10/10]
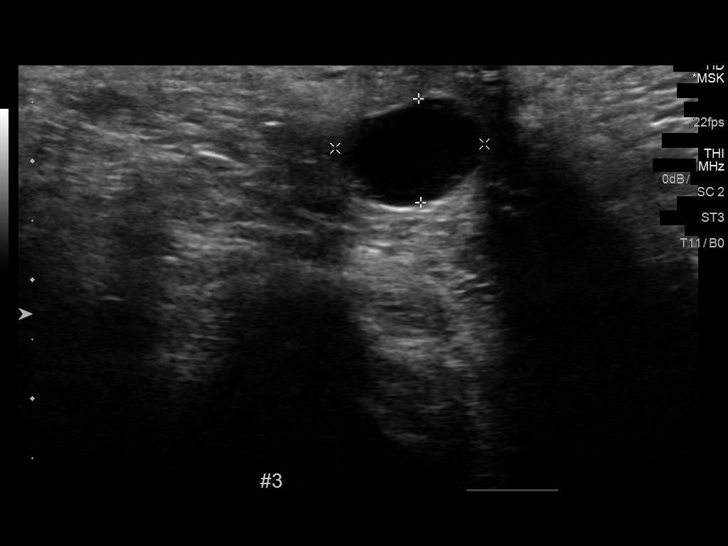

[10 of 10 positions shown; findings below may reference images not displayed]

FINDINGS: Focused sonographic assessment of the labia was performed.

On the left side there is a 1.6 by 0.7 by 1.0 cm cyst.

There is also a 2.7 by 1.8 by 2.4 cm solid-appearing lesion with
internal echoes and marginal but no definite internal vascularity.
This lesion appears to have enhanced through transmission.

On the right side there 1.8 by 0.9 by 1.3 cm cyst.
IMPRESSION: 1. Small bilateral Bartholin cysts.
2. 2.7 cm in long axis solid-appearing lesion with internal echoes
and enhanced through transmission in the left labia. Differential
diagnostic considerations include sebaceous cyst, epidermal cyst,
lipoma, peripheral nerve sheath tumor, or less likely
angiomyofibroblastoma. Malignancy such as lymphoma, carcinoma, or
melanoma considered considerably less likely given the overall
appearance. Where this lesion to enlarge or cause discomfort, tissue
diagnosis would likely be warranted.

## 2019-10-26 DIAGNOSIS — Z23 Encounter for immunization: Secondary | ICD-10-CM | POA: Diagnosis not present

## 2020-03-02 DIAGNOSIS — Z0000 Encounter for general adult medical examination without abnormal findings: Secondary | ICD-10-CM | POA: Diagnosis not present

## 2020-03-02 DIAGNOSIS — Z13220 Encounter for screening for lipoid disorders: Secondary | ICD-10-CM | POA: Diagnosis not present

## 2020-03-07 DIAGNOSIS — E782 Mixed hyperlipidemia: Secondary | ICD-10-CM | POA: Diagnosis not present

## 2020-03-07 DIAGNOSIS — Z01419 Encounter for gynecological examination (general) (routine) without abnormal findings: Secondary | ICD-10-CM | POA: Diagnosis not present

## 2020-03-07 DIAGNOSIS — Z6835 Body mass index (BMI) 35.0-35.9, adult: Secondary | ICD-10-CM | POA: Diagnosis not present

## 2020-06-30 DIAGNOSIS — Z20828 Contact with and (suspected) exposure to other viral communicable diseases: Secondary | ICD-10-CM | POA: Diagnosis not present

## 2020-07-01 DIAGNOSIS — Z6834 Body mass index (BMI) 34.0-34.9, adult: Secondary | ICD-10-CM | POA: Diagnosis not present

## 2020-07-01 DIAGNOSIS — J019 Acute sinusitis, unspecified: Secondary | ICD-10-CM | POA: Diagnosis not present

## 2020-07-01 DIAGNOSIS — R05 Cough: Secondary | ICD-10-CM | POA: Diagnosis not present

## 2020-07-05 DIAGNOSIS — Z20828 Contact with and (suspected) exposure to other viral communicable diseases: Secondary | ICD-10-CM | POA: Diagnosis not present

## 2020-07-05 DIAGNOSIS — J069 Acute upper respiratory infection, unspecified: Secondary | ICD-10-CM | POA: Diagnosis not present

## 2020-07-15 DIAGNOSIS — B029 Zoster without complications: Secondary | ICD-10-CM | POA: Diagnosis not present

## 2020-08-08 DIAGNOSIS — H60502 Unspecified acute noninfective otitis externa, left ear: Secondary | ICD-10-CM | POA: Diagnosis not present

## 2020-08-08 DIAGNOSIS — H6983 Other specified disorders of Eustachian tube, bilateral: Secondary | ICD-10-CM | POA: Diagnosis not present

## 2020-08-08 DIAGNOSIS — J302 Other seasonal allergic rhinitis: Secondary | ICD-10-CM | POA: Diagnosis not present

## 2020-10-17 DIAGNOSIS — Z1231 Encounter for screening mammogram for malignant neoplasm of breast: Secondary | ICD-10-CM | POA: Diagnosis not present

## 2022-06-12 ENCOUNTER — Encounter (HOSPITAL_BASED_OUTPATIENT_CLINIC_OR_DEPARTMENT_OTHER): Payer: Self-pay | Admitting: Obstetrics and Gynecology

## 2022-06-12 ENCOUNTER — Emergency Department (HOSPITAL_BASED_OUTPATIENT_CLINIC_OR_DEPARTMENT_OTHER)
Admission: EM | Admit: 2022-06-12 | Discharge: 2022-06-12 | Disposition: A | Discharge status: Home / Self Care | Payer: BC Managed Care – PPO | Attending: Emergency Medicine | Admitting: Emergency Medicine

## 2022-06-12 ENCOUNTER — Emergency Department (HOSPITAL_BASED_OUTPATIENT_CLINIC_OR_DEPARTMENT_OTHER): Payer: BC Managed Care – PPO | Admitting: Radiology

## 2022-06-12 ENCOUNTER — Other Ambulatory Visit: Payer: Self-pay

## 2022-06-12 ENCOUNTER — Emergency Department (HOSPITAL_BASED_OUTPATIENT_CLINIC_OR_DEPARTMENT_OTHER): Payer: BC Managed Care – PPO

## 2022-06-12 DIAGNOSIS — R079 Chest pain, unspecified: Secondary | ICD-10-CM | POA: Insufficient documentation

## 2022-06-12 DIAGNOSIS — R1013 Epigastric pain: Secondary | ICD-10-CM | POA: Insufficient documentation

## 2022-06-12 DIAGNOSIS — R112 Nausea with vomiting, unspecified: Secondary | ICD-10-CM | POA: Insufficient documentation

## 2022-06-12 LAB — HEPATIC FUNCTION PANEL
ALT: 28 U/L (ref 0–44)
AST: 23 U/L (ref 15–41)
Albumin: 4.4 g/dL (ref 3.5–5.0)
Alkaline Phosphatase: 53 U/L (ref 38–126)
Bilirubin, Direct: 0.1 mg/dL (ref 0.0–0.2)
Indirect Bilirubin: 0.4 mg/dL (ref 0.3–0.9)
Total Bilirubin: 0.5 mg/dL (ref 0.3–1.2)
Total Protein: 7.1 g/dL (ref 6.5–8.1)

## 2022-06-12 LAB — BASIC METABOLIC PANEL
Anion gap: 13 (ref 5–15)
BUN: 8 mg/dL (ref 6–20)
CO2: 20 mmol/L — ABNORMAL LOW (ref 22–32)
Calcium: 9.5 mg/dL (ref 8.9–10.3)
Chloride: 104 mmol/L (ref 98–111)
Creatinine, Ser: 0.83 mg/dL (ref 0.44–1.00)
GFR, Estimated: >60 mL/min (ref >60)
Glucose, Bld: 125 mg/dL — ABNORMAL HIGH (ref 70–99)
Potassium: 4.4 mmol/L (ref 3.5–5.1)
Sodium: 137 mmol/L (ref 135–145)

## 2022-06-12 LAB — CBC
HCT: 42.7 % (ref 36.0–46.0)
Hemoglobin: 13.6 g/dL (ref 12.0–15.0)
MCH: 27.9 pg (ref 26.0–34.0)
MCHC: 31.9 g/dL (ref 30.0–36.0)
MCV: 87.7 fL (ref 80.0–100.0)
Platelets: 187 10*3/uL (ref 150–400)
RBC: 4.87 MIL/uL (ref 3.87–5.11)
RDW: 12.8 % (ref 11.5–15.5)
WBC: 5.6 10*3/uL (ref 4.0–10.5)
nRBC: 0 % (ref 0.0–0.2)

## 2022-06-12 LAB — LIPASE, BLOOD: Lipase: 36 U/L (ref 11–51)

## 2022-06-12 LAB — TROPONIN I (HIGH SENSITIVITY)
Troponin I (High Sensitivity): <2 ng/L (ref <18)
Troponin I (High Sensitivity): <2 ng/L (ref <18)

## 2022-06-12 LAB — PREGNANCY, URINE: Preg Test, Ur: NEGATIVE

## 2022-06-12 MED ORDER — FAMOTIDINE 20 MG PO TABS: 20.0000 mg | ORAL_TABLET | Freq: Two times a day (BID) | ORAL | 0 refills | Status: AC

## 2022-06-12 MED ORDER — LIDOCAINE VISCOUS HCL 2 % MT SOLN
15.0000 mL | Freq: Once | OROMUCOSAL | Status: AC
  Administered 2022-06-12: 15 mL via ORAL
  Filled 2022-06-12: qty 15

## 2022-06-12 MED ORDER — PANTOPRAZOLE SODIUM 40 MG PO TBEC
40.0000 mg | DELAYED_RELEASE_TABLET | Freq: Once | ORAL | Status: AC
  Administered 2022-06-12: 40 mg via ORAL
  Filled 2022-06-12: qty 1

## 2022-06-12 MED ORDER — ONDANSETRON HCL 4 MG PO TABS: 4.0000 mg | ORAL_TABLET | Freq: Four times a day (QID) | ORAL | 0 refills | Status: AC

## 2022-06-12 MED ORDER — ALUM & MAG HYDROXIDE-SIMETH 200-200-20 MG/5ML PO SUSP
30.0000 mL | Freq: Once | ORAL | Status: AC
  Administered 2022-06-12: 30 mL via ORAL
  Filled 2022-06-12: qty 30

## 2022-06-12 MED ORDER — MORPHINE SULFATE (PF) 4 MG/ML IV SOLN: 4.0000 mg | Freq: Once | INTRAVENOUS | Status: DC

## 2022-06-12 MED ORDER — ONDANSETRON 4 MG PO TBDP
4.0000 mg | ORAL_TABLET | Freq: Once | ORAL | Status: AC
  Administered 2022-06-12: 4 mg via ORAL
  Filled 2022-06-12: qty 1

## 2022-06-12 NOTE — ED Notes (Signed)
Patient verbalizes understanding of discharge instructions. Opportunity for questioning and answers were provided. Patient discharged from ED.  °

## 2022-06-12 NOTE — ED Notes (Signed)
Patient transported to X-ray 

## 2022-06-12 NOTE — Discharge Instructions (Signed)
Overall, your work-up was very reassuring.  Your ultrasound was normal without any problems with your gallbladder.  Your symptoms are likely related to either acid reflux or possibly food poisoning.  I have sent you home with Zofran and Pepcid that you can take twice daily.  If your symptoms worsen, feel free to return to the emergency department for evaluation.  Otherwise you can follow-up with your PCP.

## 2022-06-12 NOTE — ED Triage Notes (Signed)
Patient reports to the ER for chest pain that is radiating to the right shoulder. Patient reports it has been worsening over the weekend. Patient denies ShOB. Patient does endorse N/V/D. Patient reports she initially had stomach pain last Thursday that she thought was from bad food and it has progressed to this.

## 2022-06-12 NOTE — ED Provider Notes (Signed)
[facility] EMERGENCY DEPT Provider Note   CSN: [id number] Arrival date & time: 06/12/22  1132     History  Chief Complaint  Patient presents with   Chest Pain    Linda Maldonado is a 46 y.o. female with history of hyperlipidemia on Crestor who presents to the emergency department for evaluation of epigastric/chest pain that started about 4 days ago and has been fairly constant since then.  Patient states that symptoms started after eating some fast food and that afterwards she had moderate amounts of belching.  Pain has been constant and is aggravated after eating.  She states pain has radiating up into her mid sternum and now to the right upper back.  She has also been having some diarrhea and several episodes of vomiting.  She has not taken any medications prior to arrival.  Patient has no previous history of gallbladder disease although notes that all the women in her family typically have had gallbladder problems at around her age.  She denies fevers, chills, shortness of breath, cough, congestion.   Chest Pain      Home Medications Prior to Admission medications   Medication Sig Start Date End Date Taking? Authorizing Provider  famotidine (PEPCID) 20 MG tablet Take 1 tablet (20 mg total) by mouth 2 (two) times daily. 06/12/22  Yes Raynald Blend R, PA-C  ondansetron (ZOFRAN) 4 MG tablet Take 1 tablet (4 mg total) by mouth every 6 (six) hours. 06/12/22  Yes Raynald Blend R, PA-C  APPLE CIDER VINEGAR PO Take 2 tablets by mouth daily.    [provider]  cholecalciferol (VITAMIN D3) 25 MCG (1000 UT) tablet Take 1,000 Units by mouth daily.    [provider]  Coenzyme Q10 (COQ-10 PO) Take 1 tablet by mouth.    [provider]  diphenhydrAMINE (BENADRYL) 25 MG tablet Take 25 mg by mouth at bedtime as needed. Patient states that she takes 2 tablets    [provider]  FLAXSEED, LINSEED, PO Take 1 tablet by mouth.    [provider]  ibuprofen (ADVIL,MOTRIN) 200 MG tablet Take 200 mg by mouth every 6 (six) hours as needed.    [provider]  Norethindrone Acetate-Ethinyl Estrad-FE (JUNEL FE 24) 1-20 MG-MCG(24) tablet Take 1 tablet by mouth daily. 09/03/18   [provider]  Prenatal Multivit-Min-Fe-FA (PRENATAL VITAMINS PO) Take 1 tablet by mouth.    [provider]  Red Yeast Rice Extract (RED YEAST RICE PO) Take 2 tablets by mouth daily.    [provider]  triamcinolone (NASACORT ALLERGY 24HR) 55 MCG/ACT AERO nasal inhaler Place 2 sprays into the nose as needed.     [provider]      Allergies    Patient has no known allergies.    Review of Systems   Review of Systems  Cardiovascular:  Positive for chest pain.    Physical Exam Updated Vital Signs BP 112/89   Pulse 65   Temp 98.2 F (36.8 C)   Resp 18   Ht 5\' 4"  (1.626 m)   Wt 81.6 kg   LMP 06/03/2022 (Approximate)   SpO2 99%   Breastfeeding No   BMI 30.90 kg/m  Physical Exam Vitals and nursing note reviewed.  Constitutional:      General: She is not in acute distress.    Appearance: She is not ill-appearing.  HENT:     Head: Atraumatic.  Eyes:     Conjunctiva/sclera: Conjunctivae normal.  Cardiovascular:  Rate and Rhythm: Normal rate and regular rhythm.     Pulses: Normal pulses.          Radial pulses are 2+ on the right side and 2+ on the left side.     Heart sounds: No murmur heard. Pulmonary:     Effort: Pulmonary effort is normal. No respiratory distress.     Breath sounds: Normal breath sounds.  Abdominal:     General: Abdomen is flat. There is no distension.     Palpations: Abdomen is soft.     Tenderness: There is abdominal tenderness. There is no right CVA tenderness or left CVA tenderness. Negative signs include Murphy's sign.     Comments: Epigastric and right upper quadrant tenderness to palpation.  Negative CVA tenderness bilaterally.  She also has mild  tenderness across the lower abdomen she states is secondary to her vomiting  Musculoskeletal:        General: Normal range of motion.     Cervical back: Normal range of motion.  Skin:    General: Skin is warm and dry.     Capillary Refill: Capillary refill takes less than 2 seconds.  Neurological:     General: No focal deficit present.     Mental Status: She is alert.  Psychiatric:        Mood and Affect: Mood normal.     ED Results / Procedures / Treatments   Labs (all labs ordered are listed, but only abnormal results are displayed) Labs Reviewed  BASIC METABOLIC PANEL - Abnormal; Notable for the following components:      Result Value   CO2 20 (*)    Glucose, Bld 125 (*)    All other components within normal limits  CBC  PREGNANCY, URINE  HEPATIC FUNCTION PANEL  LIPASE, BLOOD  TROPONIN I (HIGH SENSITIVITY)  TROPONIN I (HIGH SENSITIVITY)    EKG EKG Interpretation  Date/Time:  Tuesday June 12 2022 11:38:57 EDT Ventricular Rate:  79 PR Interval:  136 QRS Duration: 80 QT Interval:  386 QTC Calculation: 442 R Axis:   31 Text Interpretation: Normal sinus rhythm Possible Anterior infarct , age undetermined No previous ECGs available Confirmed by Gwyneth Sprout ([id number]) on 06/12/2022 1:08:16 PM  Radiology US Abdomen Limited RUQ (LIVER/GB)  Result Date: 06/12/2022 CLINICAL DATA:  Right upper quadrant pain since Saturday. EXAM: ULTRASOUND ABDOMEN LIMITED RIGHT UPPER QUADRANT COMPARISON:  None Available. FINDINGS: Gallbladder: No gallstones or wall thickening visualized. No sonographic Murphy sign noted by sonographer. Common bile duct: Diameter: 5 mm, normal. Liver: No focal lesion identified. Within normal limits in parenchymal echogenicity. Portal vein is patent on color Doppler imaging with normal direction of blood flow towards the liver. Other: None. IMPRESSION: 1. Normal right upper quadrant ultrasound. Electronically Signed   By: Obie Dredge M.D.   On: 06/12/2022  13:46   DG Chest 2 View  Result Date: 06/12/2022 CLINICAL DATA:  Chest pain EXAM: CHEST - 2 VIEW COMPARISON:  None Available. FINDINGS: The cardiomediastinal silhouette is within normal limits. There is no focal airspace disease. There is no pleural effusion. No pneumothorax. There is no acute osseous abnormality. IMPRESSION: No evidence of acute cardiopulmonary disease. Electronically Signed   By: Caprice Renshaw M.D.   On: 06/12/2022 12:04    Procedures Procedures    Medications Ordered in ED Medications  morphine (PF) 4 MG/ML injection 4 mg (4 mg Intravenous Not Given 06/12/22 1414)  alum & mag hydroxide-simeth (MAALOX/MYLANTA) 200-200-20 MG/5ML suspension 30 mL (30 mLs  Oral Given 06/12/22 1254)    And  lidocaine (XYLOCAINE) 2 % viscous mouth solution 15 mL (15 mLs Oral Given 06/12/22 1254)  pantoprazole (PROTONIX) EC tablet 40 mg (40 mg Oral Given 06/12/22 1253)  ondansetron (ZOFRAN-ODT) disintegrating tablet 4 mg (4 mg Oral Given 06/12/22 1402)    ED Course/ Medical Decision Making/ A&P                           Medical Decision Making Amount and/or Complexity of Data Reviewed Labs: ordered. Radiology: ordered.  Risk OTC drugs. Prescription drug management.   Social determinants of health:  Social History   Socioeconomic History   Marital status: Divorced    Spouse name: Not on file   Number of children: Not on file   Years of education: Not on file   Highest education level: Not on file  Occupational History   Not on file  Tobacco Use   Smoking status: Never   Smokeless tobacco: Never  Vaping Use   Vaping Use: Never used  Substance and Sexual Activity   Alcohol use: Yes    Comment: Occasional   Drug use: Not Currently    Types: Marijuana    Comment: Ocassional   Sexual activity: Yes    Birth control/protection: Pill  Other Topics Concern   Not on file  Social History Narrative   Not on file   Social Determinants of Health   Financial Resource Strain: Not  on file  Food Insecurity: Not on file  Transportation Needs: Not on file  Physical Activity: Not on file  Stress: Not on file  Social Connections: Not on file  Intimate Partner Violence: Not on file     Initial impression:  This patient presents to the ED for concern of epigastric pain/chest pain, this involves an extensive number of treatment options, and is a complaint that carries with it a high risk of complications and morbidity.   Differentials include cholecystitis, cholelithiasis, GERD, gastroenteritis, pancreatitis, ACS.   Comorbidities affecting care:  hyperlipidemia  Additional history obtained: none  Lab Tests  I Ordered, reviewed, and interpreted labs and EKG.  The pertinent results include:  Troponin normal BMP, hepatic function, lipase, CBC and pregnancy normal  Imaging Studies ordered:  I ordered imaging studies including  Chest x-ray normal RUQ ultrasound normal I independently visualized and interpreted imaging and I agree with the radiologist interpretation.   EKG: Normal sinus rhythm  Cardiac Monitoring:  The patient was maintained on a cardiac monitor.  I personally viewed and interpreted the cardiac monitored which showed an underlying rhythm of: Sinus rhythm   Medicines ordered and prescription drug management:  I ordered medication including: GI cocktail Protonix 40 mg Zofran 4 mg Reevaluation of the patient after these medicines showed that the patient  initially worsened with GI cocktail not improved with Zofran I have reviewed the patients home medicines and have made adjustments as needed   ED Course/Re-evaluation: 46 year old female presents to the ED for evaluation of epigastric/lower chest pain that began 3 days ago after eating fast food.  Vitals are without significant abnormality.  On exam, patient is in no acute distress, nontoxic.  She has epigastric tenderness to palpation and possible right upper quadrant tenderness to  palpation.  Abdomen is otherwise soft, nondistended.  No reproducible chest wall tenderness.  Her labs are overall reassuring without any acute findings and right upper quadrant ultrasound was also normal.  No findings on chest  x-ray.  Given normal ultrasound, concern for cholelithiasis or cholecystitis is is low.  It is possible that she is suffering from either acid reflux or possible food poisoning from the food she ate a few days ago.  I gave her a GI cocktail with Protonix 40 mg which seemed to aggravate patient's symptoms.  Shortly after, I gave her Zofran which seemed to improve.  Discussed that patient being transferred to [facility] or [facility] for CT scan versus medication management outpatient.  After discussion, patient wishes to trial Zofran with Pepcid for the next couple of days to see if symptoms improve.  Advised increased fluid intake.  Return precautions discussed.  Patient expresses understanding and is amenable to plan.  Disposition:  After consideration of the diagnostic results, physical exam, history and the patients response to treatment feel that the patent would benefit from discharge.   Epigastric pain Nausea and vomiting: Plan and management as described above. Discharged home in good condition. Final Clinical Impression(s) / ED Diagnoses Final diagnoses:  Epigastric pain  Nausea and vomiting, unspecified vomiting type    Rx / DC Orders ED Discharge Orders          Ordered    ondansetron (ZOFRAN) 4 MG tablet  Every 6 hours        06/12/22 1554    famotidine (PEPCID) 20 MG tablet  2 times daily        06/12/22 [address], [place] 06/12/22 1659    Gwyneth Sprout, MD 06/13/22 1514

## 2023-01-08 ENCOUNTER — Encounter: Payer: Self-pay | Admitting: Internal Medicine

## 2023-01-24 NOTE — Progress Notes (Deleted)
Referring Provider:  Caryl Bis, MD Primary Care Physician:  Caryl Bis, MD Primary Gastroenterologist:  Dr. Abbey Chatters  No chief complaint on file.   HPI:   Linda Maldonado is a 47 y.o. female presenting today at the request of  Caryl Bis, MD for nausea and vomiting.   RUQ [place] on file 09/13/22: Normal exam.  HIDA 09/17/22: Normal exam Last labs 09/13/22: CBC, CMP, Lipase without significant abnormalities.     Past Medical History:  Diagnosis Date   Anemia    Bartholin cyst    Bilateral   Chiari Frommel syndrome    Dysmenorrhea    External hemorrhoids    Follicular cyst of ovary    Gestational diabetes 1996   High cholesterol    Menorrhagia    Pre-diabetes    Pregnancy induced hypertension    Seasonal allergies    Vulvar cysts    Vulvar mass     Past Surgical History:  Procedure Laterality Date   DILATION AND CURETTAGE OF UTERUS  1999   VULVECTOMY N/A 09/30/2018   Procedure: WIDE LOCAL EXCISION VULVAR;  Surgeon: Marti Sleigh, MD;  Location: [facility];  Service: Gynecology;  Laterality: N/A;   WISDOM TOOTH EXTRACTION  2009    Current Outpatient Medications  Medication Sig Dispense Refill   APPLE CIDER VINEGAR PO Take 2 tablets by mouth daily.     cholecalciferol (VITAMIN D3) 25 MCG (1000 UT) tablet Take 1,000 Units by mouth daily.     Coenzyme Q10 (COQ-10 PO) Take 1 tablet by mouth.     diphenhydrAMINE (BENADRYL) 25 MG tablet Take 25 mg by mouth at bedtime as needed. Patient states that she takes 2 tablets     famotidine (PEPCID) 20 MG tablet Take 1 tablet (20 mg total) by mouth 2 (two) times daily. 30 tablet 0   FLAXSEED, LINSEED, PO Take 1 tablet by mouth.     ibuprofen (ADVIL,MOTRIN) 200 MG tablet Take 200 mg by mouth every 6 (six) hours as needed.     Norethindrone Acetate-Ethinyl Estrad-FE (JUNEL FE 24) 1-20 MG-MCG(24) tablet Take 1 tablet by mouth daily.     ondansetron (ZOFRAN) 4 MG tablet Take 1 tablet (4 mg total) by  mouth every 6 (six) hours. 25 tablet 0   Prenatal Multivit-Min-Fe-FA (PRENATAL VITAMINS PO) Take 1 tablet by mouth.     Red Yeast Rice Extract (RED YEAST RICE PO) Take 2 tablets by mouth daily.     triamcinolone (NASACORT ALLERGY 24HR) 55 MCG/ACT AERO nasal inhaler Place 2 sprays into the nose as needed.      No current facility-administered medications for this visit.    Allergies as of 01/25/2023   (No Known Allergies)    Family History  Problem Relation Age of Onset   Stroke Mother    Diabetes Maternal Grandmother    Breast cancer Cousin    Diabetes Other     Social History   Socioeconomic History   Marital status: Divorced    Spouse name: Not on file   Number of children: Not on file   Years of education: Not on file   Highest education level: Not on file  Occupational History   Not on file  Tobacco Use   Smoking status: Never   Smokeless tobacco: Never  Vaping Use   Vaping Use: Never used  Substance and Sexual Activity   Alcohol use: Yes    Comment: Occasional   Drug use: Not Currently  Types: Marijuana    Comment: Ocassional   Sexual activity: Yes    Birth control/protection: Pill  Other Topics Concern   Not on file  Social History Narrative   Not on file   Social Determinants of Health   Financial Resource Strain: Not on file  Food Insecurity: Not on file  Transportation Needs: Not on file  Physical Activity: Not on file  Stress: Not on file  Social Connections: Not on file  Intimate Partner Violence: Not on file    Review of Systems: Gen: Denies any fever, chills, fatigue, weight loss, lack of appetite.  CV: Denies chest pain, heart palpitations, peripheral edema, syncope.  Resp: Denies shortness of breath at rest or with exertion. Denies wheezing or cough.  GI: Denies dysphagia or odynophagia. Denies jaundice, hematemesis, fecal incontinence. GU : Denies urinary burning, urinary frequency, urinary hesitancy MS: Denies joint pain, muscle  weakness, cramps, or limitation of movement.  Derm: Denies rash, itching, dry skin Psych: Denies depression, anxiety, memory loss, and confusion Heme: Denies bruising, bleeding, and enlarged lymph nodes.  Physical Exam: There were no vitals taken for this visit. General:   Alert and oriented. Pleasant and cooperative. Well-nourished and well-developed.  Head:  Normocephalic and atraumatic. Eyes:  Without icterus, sclera clear and conjunctiva pink.  Ears:  Normal auditory acuity. Lungs:  Clear to auscultation bilaterally. No wheezes, rales, or rhonchi. No distress.  Heart:  S1, S2 present without murmurs appreciated.  Abdomen:  +BS, soft, non-tender and non-distended. No HSM noted. No guarding or rebound. No masses appreciated.  Rectal:  Deferred  Msk:  Symmetrical without gross deformities. Normal posture. Extremities:  Without edema. Neurologic:  Alert and  oriented x4;  grossly normal neurologically. Skin:  Intact without significant lesions or rashes. Psych:  Alert and cooperative. Normal mood and affect.    Assessment:     Plan:  ***   Aliene Altes, PA-C [facility] 01/25/2023

## 2023-01-25 ENCOUNTER — Ambulatory Visit: Payer: BC Managed Care – PPO | Admitting: Gastroenterology

## 2023-01-25 ENCOUNTER — Encounter: Payer: Self-pay | Admitting: Gastroenterology

## 2023-03-11 ENCOUNTER — Other Ambulatory Visit (HOSPITAL_BASED_OUTPATIENT_CLINIC_OR_DEPARTMENT_OTHER): Payer: Self-pay

## 2023-03-11 ENCOUNTER — Encounter (HOSPITAL_BASED_OUTPATIENT_CLINIC_OR_DEPARTMENT_OTHER): Payer: Self-pay | Admitting: Emergency Medicine

## 2023-03-11 ENCOUNTER — Other Ambulatory Visit: Payer: Self-pay

## 2023-03-11 ENCOUNTER — Emergency Department (HOSPITAL_BASED_OUTPATIENT_CLINIC_OR_DEPARTMENT_OTHER)
Admission: EM | Admit: 2023-03-11 | Discharge: 2023-03-11 | Disposition: A | Discharge status: Home / Self Care | Payer: BC Managed Care – PPO | Attending: Emergency Medicine | Admitting: Emergency Medicine

## 2023-03-11 ENCOUNTER — Emergency Department (HOSPITAL_BASED_OUTPATIENT_CLINIC_OR_DEPARTMENT_OTHER): Payer: BC Managed Care – PPO | Admitting: Radiology

## 2023-03-11 DIAGNOSIS — R0789 Other chest pain: Secondary | ICD-10-CM | POA: Diagnosis not present

## 2023-03-11 DIAGNOSIS — R42 Dizziness and giddiness: Secondary | ICD-10-CM | POA: Diagnosis not present

## 2023-03-11 DIAGNOSIS — M549 Dorsalgia, unspecified: Secondary | ICD-10-CM | POA: Insufficient documentation

## 2023-03-11 DIAGNOSIS — M25512 Pain in left shoulder: Secondary | ICD-10-CM | POA: Diagnosis not present

## 2023-03-11 DIAGNOSIS — R202 Paresthesia of skin: Secondary | ICD-10-CM | POA: Insufficient documentation

## 2023-03-11 DIAGNOSIS — R519 Headache, unspecified: Secondary | ICD-10-CM

## 2023-03-11 LAB — PREGNANCY, URINE: Preg Test, Ur: NEGATIVE

## 2023-03-11 LAB — BASIC METABOLIC PANEL
Anion gap: 7 (ref 5–15)
BUN: 9 mg/dL (ref 6–20)
CO2: 24 mmol/L (ref 22–32)
Calcium: 9.5 mg/dL (ref 8.9–10.3)
Chloride: 108 mmol/L (ref 98–111)
Creatinine, Ser: 0.79 mg/dL (ref 0.44–1.00)
GFR, Estimated: >60 mL/min (ref >60)
Glucose, Bld: 110 mg/dL — ABNORMAL HIGH (ref 70–99)
Potassium: 3.8 mmol/L (ref 3.5–5.1)
Sodium: 139 mmol/L (ref 135–145)

## 2023-03-11 LAB — TROPONIN I (HIGH SENSITIVITY)
Troponin I (High Sensitivity): <2 ng/L (ref <18)
Troponin I (High Sensitivity): <2 ng/L (ref <18)

## 2023-03-11 LAB — CBC
HCT: 39.6 % (ref 36.0–46.0)
Hemoglobin: 12.9 g/dL (ref 12.0–15.0)
MCH: 28.1 pg (ref 26.0–34.0)
MCHC: 32.6 g/dL (ref 30.0–36.0)
MCV: 86.3 fL (ref 80.0–100.0)
Platelets: 241 10*3/uL (ref 150–400)
RBC: 4.59 MIL/uL (ref 3.87–5.11)
RDW: 13.5 % (ref 11.5–15.5)
WBC: 8 10*3/uL (ref 4.0–10.5)
nRBC: 0 % (ref 0.0–0.2)

## 2023-03-11 NOTE — ED Provider Notes (Signed)
[facility] Provider Note   CSN: [id number] Arrival date & time: 03/11/23  0825     History  Chief Complaint  Patient presents with   Headache   Shoulder Pain   Back Pain    Linda Maldonado is a 47 y.o. female with past medical history significant for hyperlipidemia, anemia, gestational diabetes, gestational hypertension who presents with concern for headache, some shoulder pain, dull chest pain, and back pain.  Patient reports that she had some tingling of the left arm this morning at 5 AM, patient reports that she has had a headache for about 45 minutes ago, started at lower left side, radiated to the right side of the head.  Headache is now more generalized, she has noticed some ringing in the ears and feeling like she can hear her heartbeat.  She reports that she has been intermittently dizzy but without any difficulties walking.  No previous history of ACS although she endorses strong family history.  She denies any tobacco use, previous stroke.  Patient reports that she is mostly chest pain-free at rest at this time, some return of chest pain with movement.   Headache Associated symptoms: back pain   Shoulder Pain Associated symptoms: back pain   Back Pain Associated symptoms: headaches        Home Medications Prior to Admission medications   Medication Sig Start Date End Date Taking? Authorizing Provider  APPLE CIDER VINEGAR PO Take 2 tablets by mouth daily.    [provider]  cholecalciferol (VITAMIN D3) 25 MCG (1000 UT) tablet Take 1,000 Units by mouth daily.    [provider]  Coenzyme Q10 (COQ-10 PO) Take 1 tablet by mouth.    [provider]  diphenhydrAMINE (BENADRYL) 25 MG tablet Take 25 mg by mouth at bedtime as needed. Patient states that she takes 2 tablets    [provider]  famotidine (PEPCID) 20 MG tablet Take 1 tablet (20 mg total) by mouth 2 (two) times daily. 06/12/22    Kathe Becton R, PA-C  FLAXSEED, LINSEED, PO Take 1 tablet by mouth.    [provider]  ibuprofen (ADVIL,MOTRIN) 200 MG tablet Take 200 mg by mouth every 6 (six) hours as needed.    [provider]  Norethindrone Acetate-Ethinyl Estrad-FE (JUNEL FE 24) 1-20 MG-MCG(24) tablet Take 1 tablet by mouth daily. 09/03/18   [provider]  ondansetron (ZOFRAN) 4 MG tablet Take 1 tablet (4 mg total) by mouth every 6 (six) hours. 06/12/22   Tonye Pearson, PA-C  Prenatal Multivit-Min-Fe-FA (PRENATAL VITAMINS PO) Take 1 tablet by mouth.    [provider]  Red Yeast Rice Extract (RED YEAST RICE PO) Take 2 tablets by mouth daily.    [provider]  triamcinolone (NASACORT ALLERGY 24HR) 55 MCG/ACT AERO nasal inhaler Place 2 sprays into the nose as needed.     [provider]      Allergies    Patient has no known allergies.    Review of Systems   Review of Systems  Musculoskeletal:  Positive for back pain.  Neurological:  Positive for headaches.  All other systems reviewed and are negative.   Physical Exam Updated Vital Signs BP 114/77   Pulse 68   Temp 98.2 F (36.8 C) (Oral)   Resp 14   Ht 5\' 4"  (1.626 m)   Wt 88.5 kg   LMP 02/18/2023 (Approximate)   SpO2 98%   BMI 33.47 kg/m  Physical Exam Vitals and nursing note reviewed.  Constitutional:      General: She is not in acute distress.    Appearance: Normal appearance.  HENT:     Head: Normocephalic and atraumatic.  Eyes:     General:        Right eye: No discharge.        Left eye: No discharge.  Cardiovascular:     Rate and Rhythm: Normal rate and regular rhythm.     Heart sounds: No murmur heard.    No friction rub. No gallop.  Pulmonary:     Effort: Pulmonary effort is normal.     Breath sounds: Normal breath sounds.  Abdominal:     General: Bowel sounds are normal.     Palpations: Abdomen is soft.  Skin:    General: Skin is warm and dry.     Capillary Refill:  Capillary refill takes less than 2 seconds.  Neurological:     Mental Status: She is alert and oriented to person, place, and time.     Comments: Moves all 4 limbs spontaneously, CN II through XII grossly intact, can ambulate without difficulty, intact sensation throughout.  Psychiatric:        Mood and Affect: Mood normal.        Behavior: Behavior normal.     ED Results / Procedures / Treatments   Labs (all labs ordered are listed, but only abnormal results are displayed) Labs Reviewed  BASIC METABOLIC PANEL - Abnormal; Notable for the following components:      Result Value   Glucose, Bld 110 (*)    All other components within normal limits  CBC  PREGNANCY, URINE  TROPONIN I (HIGH SENSITIVITY)  TROPONIN I (HIGH SENSITIVITY)    EKG EKG Interpretation  Date/Time:  Monday March 11 2023 08:41:42 EDT Ventricular Rate:  77 PR Interval:  140 QRS Duration: 94 QT Interval:  377 QTC Calculation: 427 R Axis:   27 Text Interpretation: Sinus rhythm normal, no sig change from previous Confirmed by Charlesetta Shanks [phone]) on 03/11/2023 9:54:59 AM  Radiology DG Chest 2 View  Result Date: 03/11/2023 CLINICAL DATA:  Chest pain and back pain. EXAM: CHEST - 2 VIEW COMPARISON:  06/12/2022. FINDINGS: Clear lungs. Normal heart size and mediastinal contours. No pleural effusion or pneumothorax. Visualized bones and upper abdomen are unremarkable. IMPRESSION: No evidence of acute cardiopulmonary disease. Electronically Signed   By: Emmit Alexanders M.D.   On: 03/11/2023 09:00    Procedures Procedures    Medications Ordered in ED Medications - No data to display  ED Course/ Medical Decision Making/ A&P             HEART Score: 1                Medical Decision Making Amount and/or Complexity of Data Reviewed Labs: ordered. Radiology: ordered.   This patient is a 47 y.o. female  who presents to the ED for concern of chest pain, headache.   Differential diagnoses prior to  evaluation: The emergent differential diagnosis includes, but is not limited to,  ACS, AAS, PE, Mallory-Weiss, Boerhaave's, Pneumonia, acute bronchitis, asthma or COPD exacerbation, anxiety, MSK pain or traumatic injury to the chest, acid reflux versus other, Stroke, increased ICP, meningitis, CVA, intracranial tumor, venous sinus thrombosis, migraine, cluster headache, hypertension, drug related, head injury, tension headache, sinusitis, dental abscess, otitis media, TMJ . This is not an exhaustive differential.   Past Medical History / Co-morbidities: Patient endorses  anxiety, panic attacks, hyperlipidemia, anemia, gestational hypertension, gestational diabetes  Additional history: Chart reviewed. Pertinent results include: No previous stress test, echo, or other cardiology evaluation  Physical Exam: Physical exam performed. The pertinent findings include: No focal neurologic deficits, vital signs are stable including no hypertension on arrival, no tachycardia  Lab Tests/Imaging studies: I personally interpreted labs/imaging and the pertinent results include: BMP unremarkable, CBC unremarkable, troponin negative x 2.  Pregnancy test negative..  Plain film chest x-ray is unremarkable.  I agree with the radiologist interpretation.  Cardiac monitoring: EKG obtained and interpreted by my attending physician which shows: Normal sinus rhythm   Disposition: After consideration of the diagnostic results and the patients response to treatment, I feel that patient with some vague headache, dizziness, chest pain which does not seem exertional in nature, history of anxiety, suspect musculoskeletal related pain versus anxiety, panic attack, low suspicion for ACS, stroke.  Patient with heart score of 1   emergency department workup does not suggest an emergent condition requiring admission or immediate intervention beyond what has been performed at this time. The plan is: Encouraged ibuprofen, Tylenol,  rest, plenty of fluids, PCP follow-up as needed. The patient is safe for discharge and has been instructed to return immediately for worsening symptoms, change in symptoms or any other concerns.  Final Clinical Impression(s) / ED Diagnoses Final diagnoses:  Acute nonintractable headache, unspecified headache type  Atypical chest pain    Rx / DC Orders ED Discharge Orders     None         Dorien Chihuahua 03/11/23 1125    Charlesetta Shanks, MD 03/13/23 1054

## 2023-03-11 NOTE — ED Notes (Signed)
Pt given discharge instructions. Opportunities given for questions. Pt verbalizes understanding. PIV removed x1. Mckynlee Luse R, RN 

## 2023-03-11 NOTE — Discharge Instructions (Signed)
Please use Tylenol or ibuprofen for pain.  You may use 600 mg ibuprofen every 6 hours or 1000 mg of Tylenol every 6 hours.  You may choose to alternate between the 2.  This would be most effective.  Not to exceed 4 g of Tylenol within 24 hours.  Not to exceed 3200 mg ibuprofen 24 hours.  I recommend plenty of fluids, rest.

## 2023-03-11 NOTE — ED Triage Notes (Addendum)
Pt arrives to ED with c/o headache, shoulder pain, chest pain, and back pain. She notes that she developed left arm pain this morning at 5am that she describes as numbness/tingling. She also notes mid thoracic back pain that started this morning at 5am.  She states the headache started 45 minutes ago that started left sided, now more generalized, associated with tinnitus and dizziness. She notes minimal chest pain that started around 6am.

## 2023-10-12 IMAGING — DX DG CHEST 2V
2 series · 2 of 2 positions shown · non-contrast
Comparison: None Available.

CLINICAL DATA: Chest pain

EXAM:
CHEST - 2 VIEW

[chest pa]
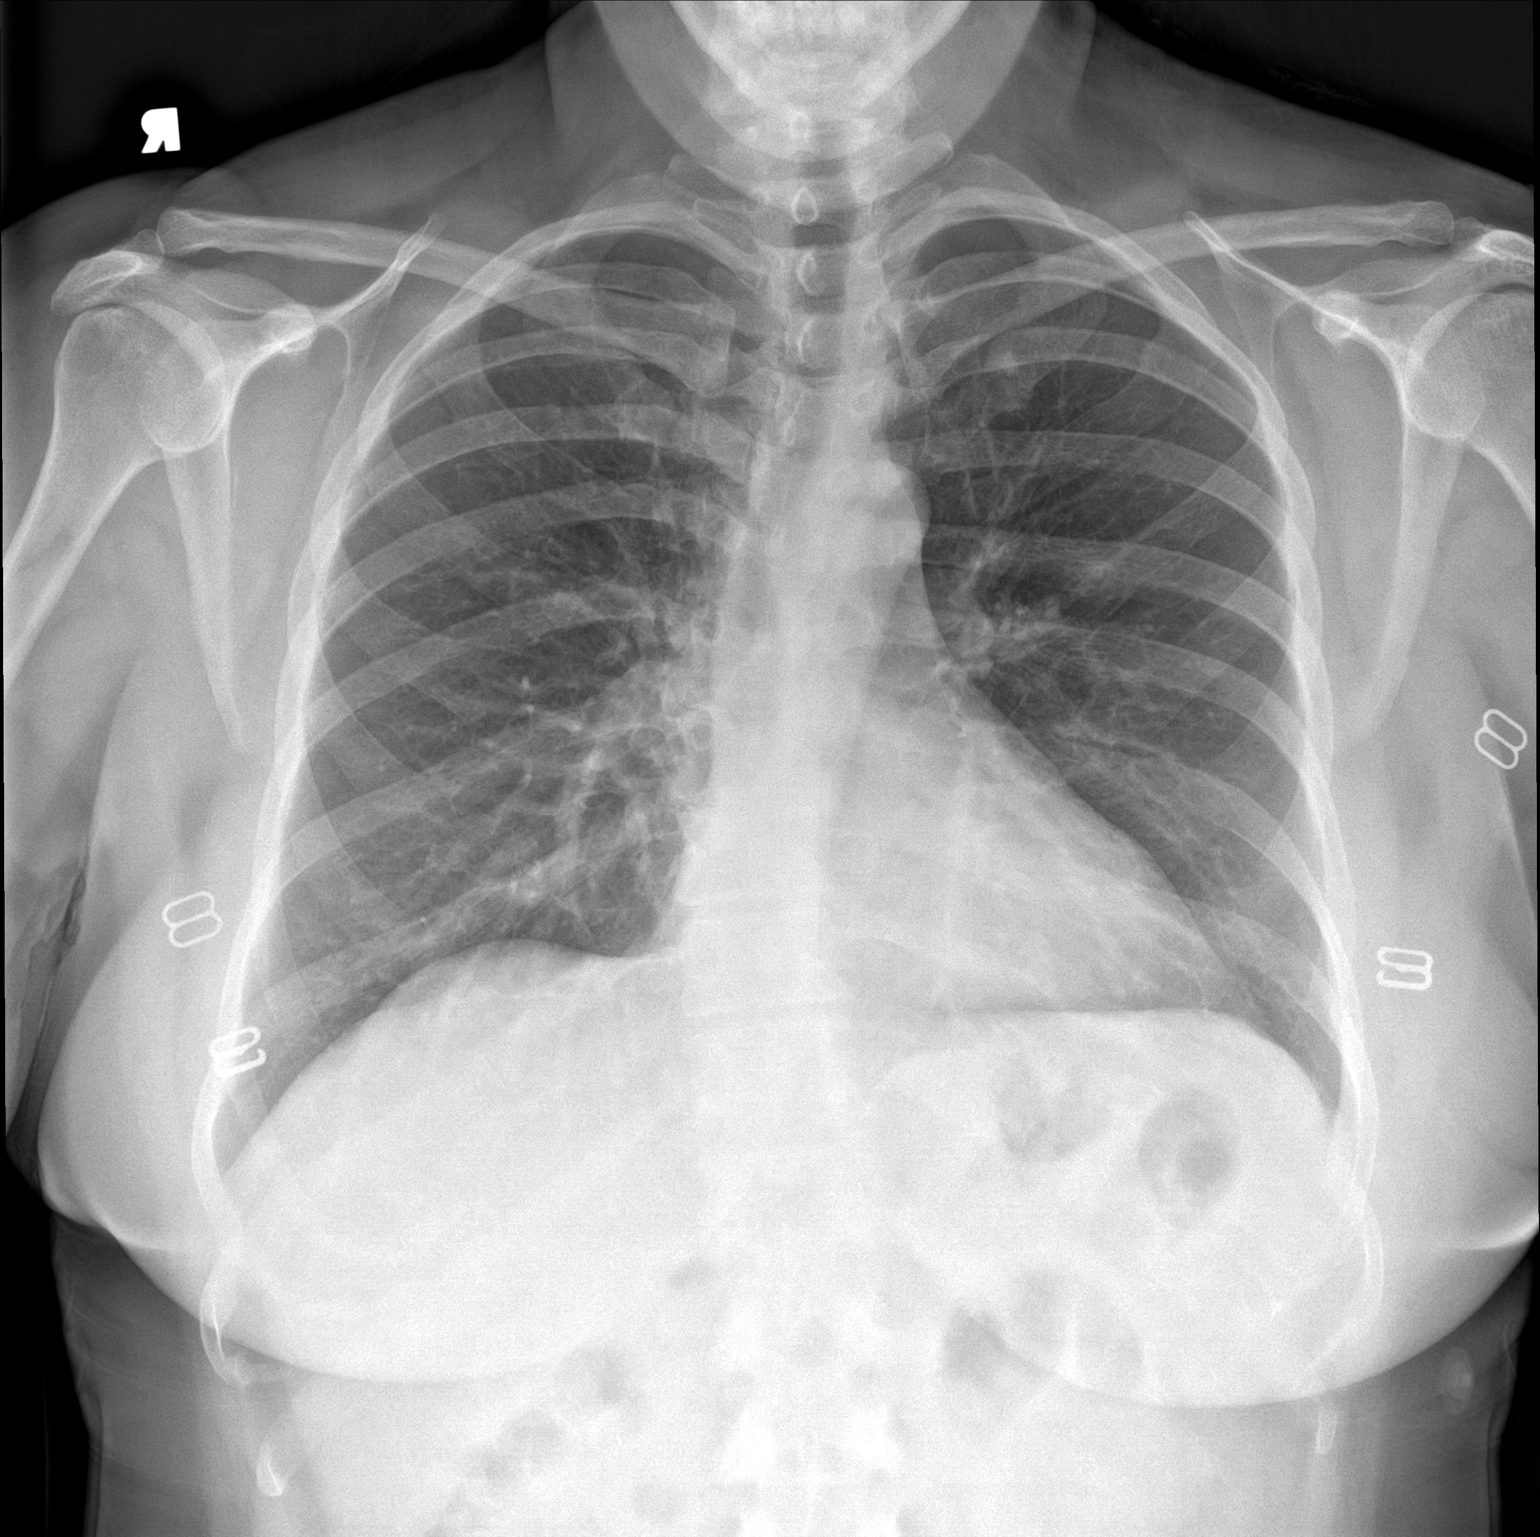

[chest lat]
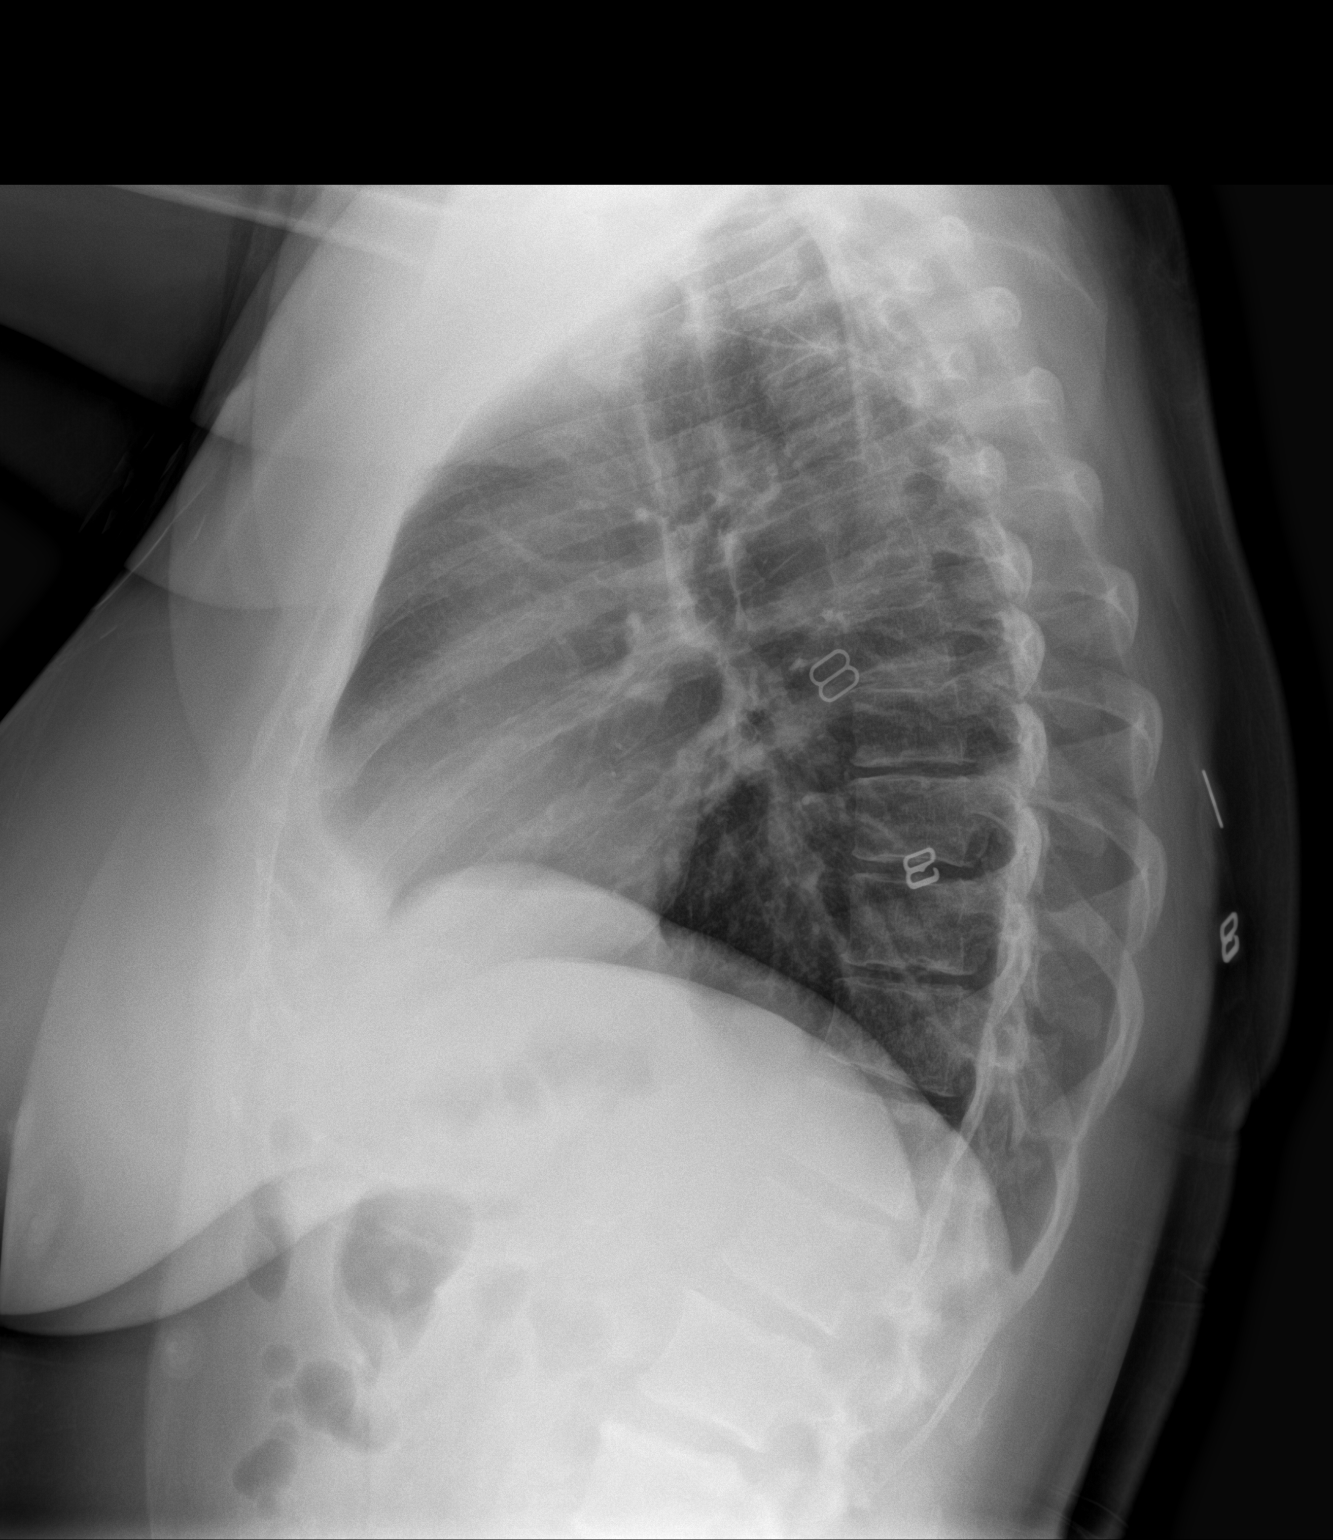

[2 of 2 positions shown; findings below may reference images not displayed]

FINDINGS: The cardiomediastinal silhouette is within normal limits. There is
no focal airspace disease. There is no pleural effusion. No
pneumothorax. There is no acute osseous abnormality.
IMPRESSION: No evidence of acute cardiopulmonary disease.

## 2023-10-12 IMAGING — US US ABDOMEN LIMITED
1 series · 14 of 25 positions shown · non-contrast
Comparison: None Available.

CLINICAL DATA: Right upper quadrant pain since [REDACTED].

EXAM:
ULTRASOUND ABDOMEN LIMITED RIGHT UPPER QUADRANT

[Series 1: us abdomen limited ruq (liver/gb) · 14 of 37 slices shown]
[im 1/37]
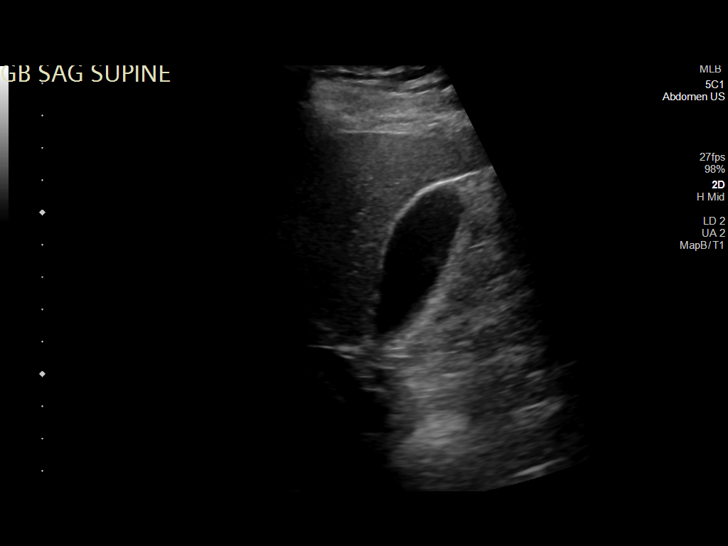
[im 4/37]
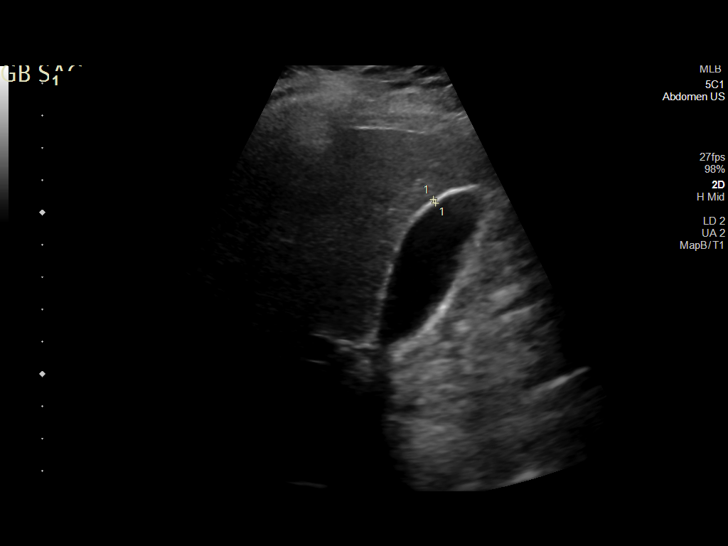
[im 7/37]
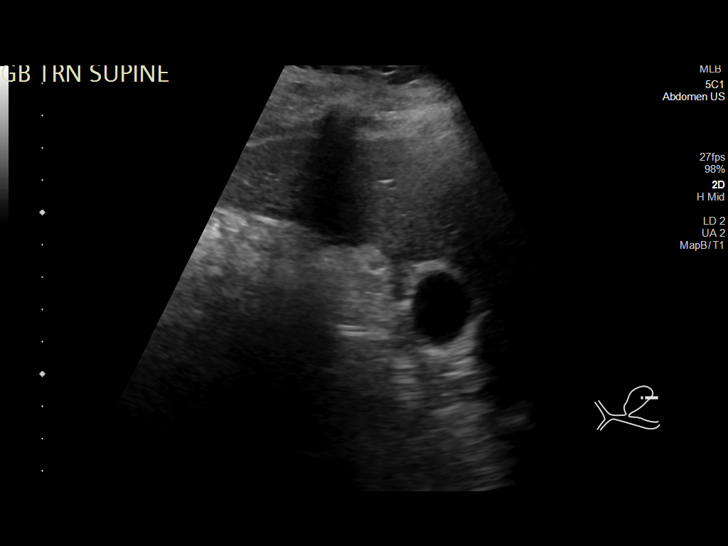
[im 10/37]
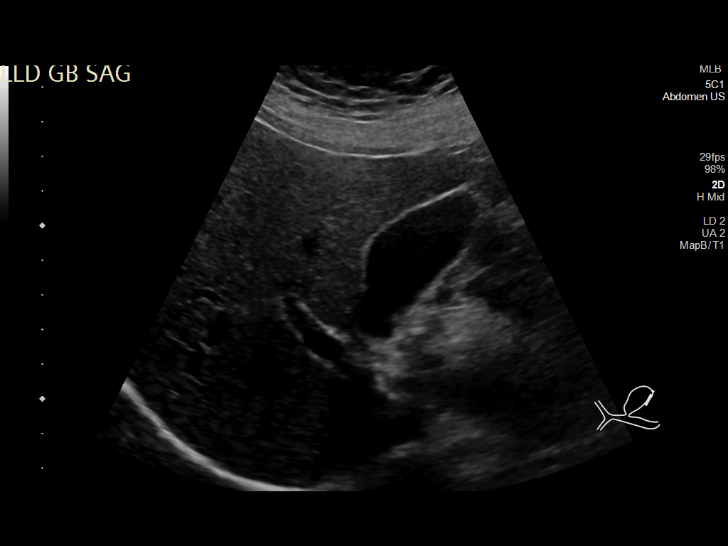
[im 13/37]
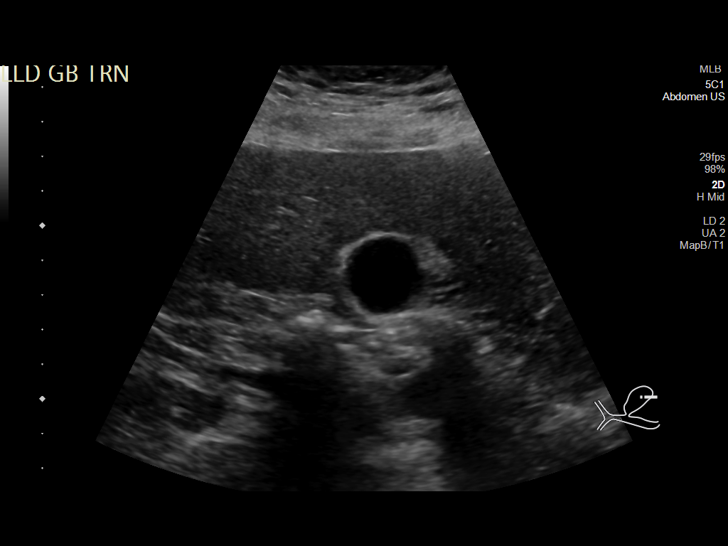
[im 14/37]
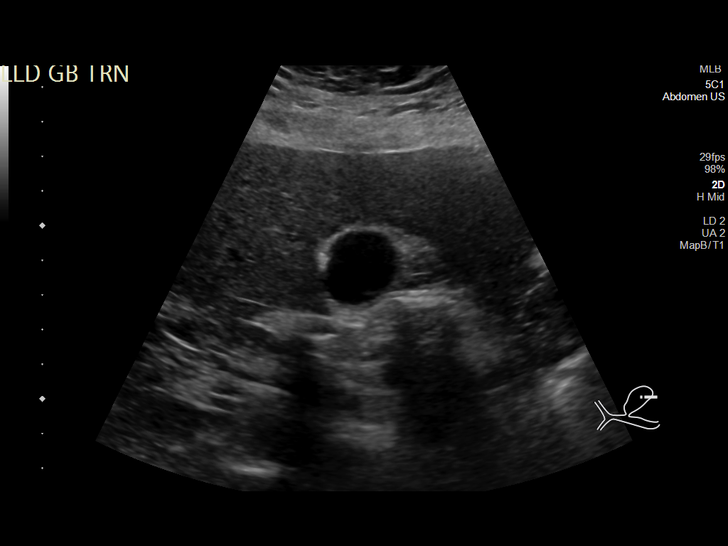
[im 17/37]
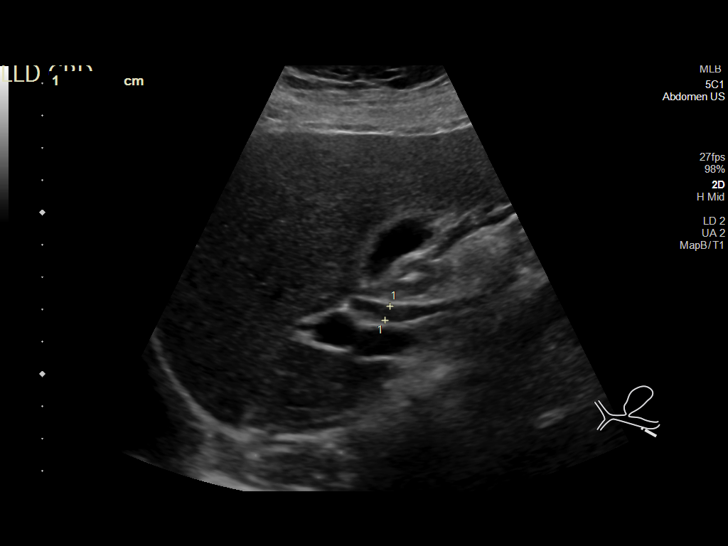
[im 20/37]
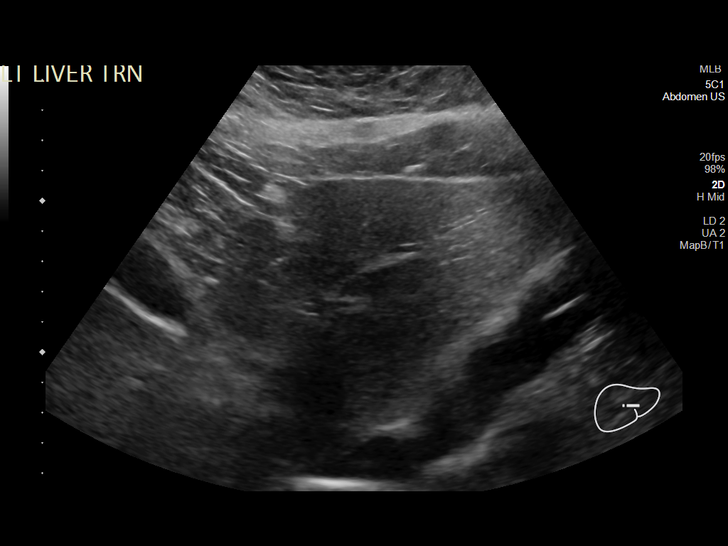
[im 23/37]
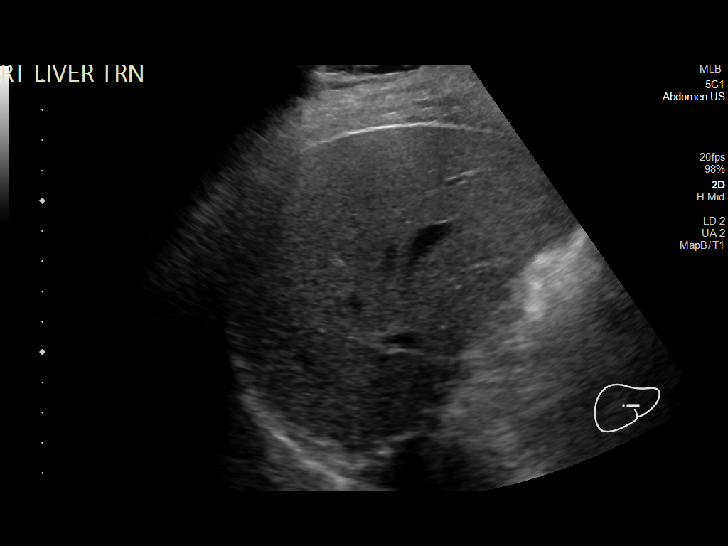
[im 25/37]
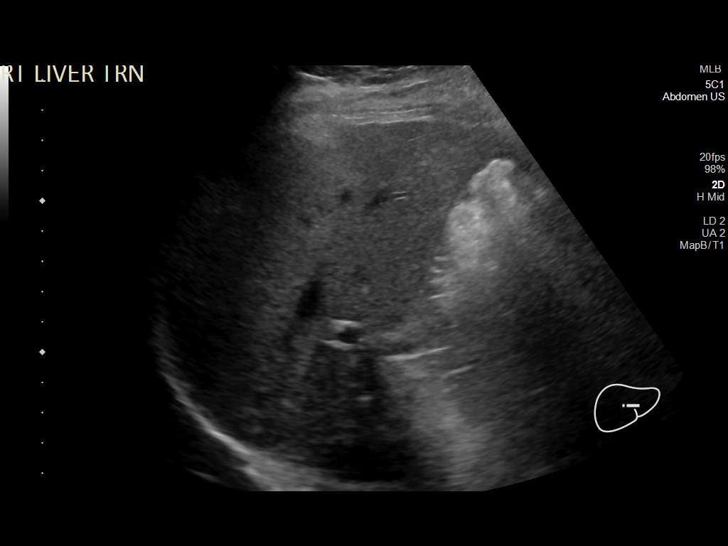
[im 28/37]
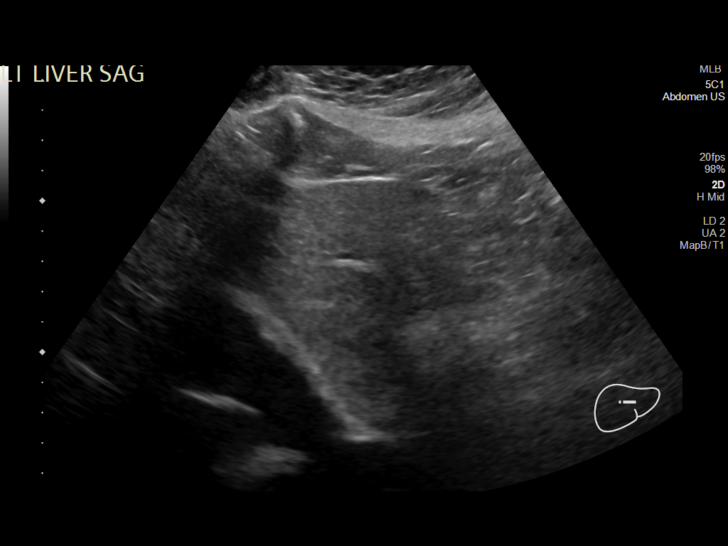
[im 31/37]
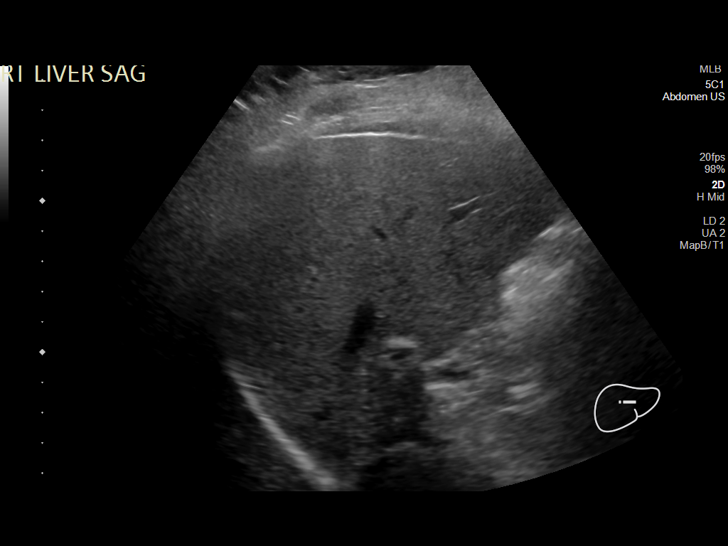
[im 34/37]
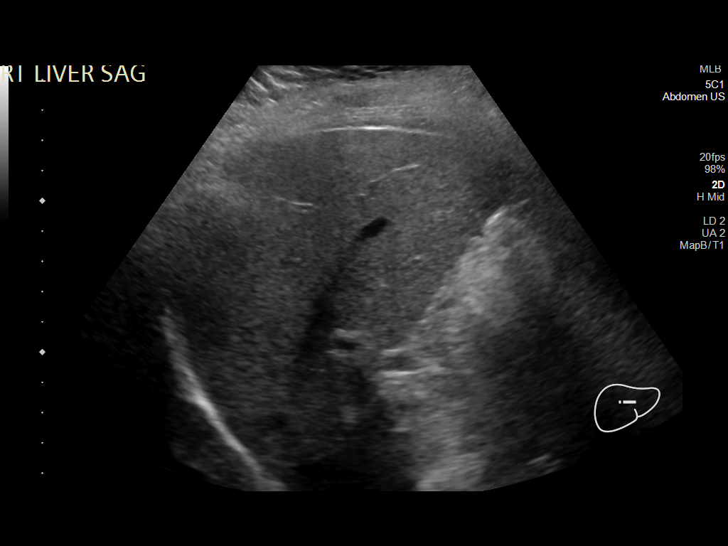
[im 37/37]
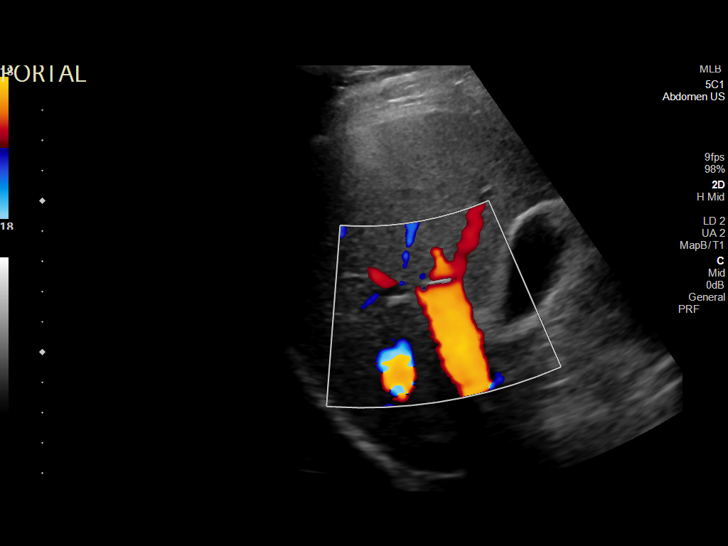

[14 of 25 positions shown; findings below may reference images not displayed]

FINDINGS: Gallbladder:

No gallstones or wall thickening visualized. No sonographic Murphy
sign noted by sonographer.

Common bile duct:

Diameter: 5 mm, normal.

Liver:

No focal lesion identified. Within normal limits in parenchymal
echogenicity. Portal vein is patent on color Doppler imaging with
normal direction of blood flow towards the liver.

Other: None.
IMPRESSION: 1. Normal right upper quadrant ultrasound.
